# Patient Record
Sex: Male | Born: 1960 | Race: Black or African American | Hispanic: No | Marital: Married | Smoking: Never smoker
Health system: Southern US, Community
[De-identification: ages and names within clinical notes are randomized; demographics above are authoritative.]

## PROBLEM LIST (undated history)

## (undated) DIAGNOSIS — M549 Dorsalgia, unspecified: Secondary | ICD-10-CM

## (undated) DIAGNOSIS — G8929 Other chronic pain: Secondary | ICD-10-CM

## (undated) DIAGNOSIS — D751 Secondary polycythemia: Secondary | ICD-10-CM

## (undated) DIAGNOSIS — M519 Unspecified thoracic, thoracolumbar and lumbosacral intervertebral disc disorder: Secondary | ICD-10-CM

## (undated) DIAGNOSIS — R7989 Other specified abnormal findings of blood chemistry: Secondary | ICD-10-CM

## (undated) DIAGNOSIS — K219 Gastro-esophageal reflux disease without esophagitis: Secondary | ICD-10-CM

## (undated) HISTORY — DX: Other chronic pain: M54.9

## (undated) HISTORY — DX: Other specified abnormal findings of blood chemistry: R79.89

## (undated) HISTORY — DX: Secondary polycythemia: D75.1

## (undated) HISTORY — DX: Unspecified thoracic, thoracolumbar and lumbosacral intervertebral disc disorder: M51.9

## (undated) HISTORY — DX: Other chronic pain: G89.29

## (undated) HISTORY — PX: LIPOMA EXCISION: SHX5283

## (undated) HISTORY — DX: Gastro-esophageal reflux disease without esophagitis: K21.9

---

## 2001-07-17 ENCOUNTER — Emergency Department (HOSPITAL_COMMUNITY): Admission: EM | Admit: 2001-07-17 | Discharge: 2001-07-17 | Payer: Self-pay | Admitting: Emergency Medicine

## 2001-07-29 ENCOUNTER — Encounter: Payer: Self-pay | Admitting: Family Medicine

## 2001-07-29 ENCOUNTER — Ambulatory Visit (HOSPITAL_COMMUNITY): Admission: RE | Admit: 2001-07-29 | Discharge: 2001-07-29 | Payer: Self-pay | Admitting: Family Medicine

## 2003-03-15 ENCOUNTER — Ambulatory Visit (HOSPITAL_COMMUNITY): Admission: RE | Admit: 2003-03-15 | Discharge: 2003-03-15 | Payer: Self-pay | Admitting: Internal Medicine

## 2004-03-06 ENCOUNTER — Ambulatory Visit (HOSPITAL_COMMUNITY): Admission: RE | Admit: 2004-03-06 | Discharge: 2004-03-06 | Payer: Self-pay | Admitting: General Surgery

## 2007-12-25 ENCOUNTER — Emergency Department (HOSPITAL_COMMUNITY): Admission: EM | Admit: 2007-12-25 | Discharge: 2007-12-25 | Payer: Self-pay | Admitting: Emergency Medicine

## 2007-12-29 ENCOUNTER — Ambulatory Visit (HOSPITAL_COMMUNITY): Admission: RE | Admit: 2007-12-29 | Discharge: 2007-12-29 | Payer: Self-pay | Admitting: Internal Medicine

## 2008-01-15 ENCOUNTER — Encounter (HOSPITAL_COMMUNITY): Admission: RE | Admit: 2008-01-15 | Discharge: 2008-02-14 | Payer: Self-pay | Admitting: Orthopaedic Surgery

## 2008-03-03 ENCOUNTER — Encounter: Admission: RE | Admit: 2008-03-03 | Discharge: 2008-03-03 | Payer: Self-pay | Admitting: Neurosurgery

## 2008-03-25 ENCOUNTER — Encounter: Admission: RE | Admit: 2008-03-25 | Discharge: 2008-03-25 | Payer: Self-pay | Admitting: Neurosurgery

## 2009-03-16 ENCOUNTER — Ambulatory Visit (HOSPITAL_COMMUNITY): Admission: RE | Admit: 2009-03-16 | Discharge: 2009-03-16 | Payer: Self-pay | Admitting: Family Medicine

## 2010-12-08 NOTE — H&P (Signed)
[name], [name]                           ACCOUNT NO.:  [id number]   MEDICAL RECORD NO.:  [id number]                   PATIENT TYPE:   LOCATION:                                       FACILITY:   PHYSICIAN:  Dalia Heading, M.D.               DATE OF BIRTH:  January 03, 1961   DATE OF ADMISSION:  DATE OF DISCHARGE:                                HISTORY & PHYSICAL   CHIEF COMPLAINT:  Masses, neck and back.   HISTORY OF PRESENT ILLNESS:  The patient is a 50 year old black male who was  referred for evaluation and treatment of masses on his neck and back.  Both  have been present for some time but recently have increased in size.  No  drainage has been noted.   PAST MEDICAL HISTORY:  Chronic back pain.   PAST SURGICAL HISTORY:  Unremarkable.   CURRENT MEDICATIONS:  1. Flexeril.  2. Lorcet Plus.   ALLERGIES:  NO KNOWN DRUG ALLERGIES.   REVIEW OF SYSTEMS:  Noncontributory.   PHYSICAL EXAMINATION:  GENERAL:  The patient is a well-developed, well-  nourished black male in no acute distress.  VITAL SIGNS:  He is afebrile, and vital signs are stable.  NECK:  Examination reveals a 2-cm oval, subcutaneous, mobile mass noted  along the left lower neck.  No lymphadenopathy is noted.  LUNGS:  Clear to auscultation, with equal breath sounds bilaterally.  HEART:  Regular rate and rhythm without S3, S4, or murmurs.  BACK:  Examination reveals a 6-cm oval, subcutaneous mass in the left upper  back.   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   PLAN:  The patient is scheduled for excision of the masses, back and neck,  on 03/06/2004.  The risks and benefits of the procedure, including bleeding,  infection, and the possibility of recurrence of the masses were fully  explained to the patient who gave informed consent.     ___________________________________________                                         Dalia Heading, M.D.   MAJ/MEDQ  D:  02/29/2004  T:  02/29/2004  Job:   [id number]   cc:   Patrica Duel, M.D.  [address]  [place]  [place] [address]  Fax: [phone]

## 2010-12-08 NOTE — Op Note (Signed)
NAMETODRICK, SIEDSCHLAG                         ACCOUNT NO.:  [id number]   MEDICAL RECORD NO.:  [id number]                   PATIENT TYPE:  AMB   LOCATION:  DAY                                  FACILITY:  [facility]   PHYSICIAN:  Dalia Heading, M.D.               DATE OF BIRTH:  March 04, 1961   DATE OF PROCEDURE:  03/06/2004  DATE OF DISCHARGE:                                 OPERATIVE REPORT   PREOPERATIVE DIAGNOSIS:  Enlarging masses, neck and back.   POSTOPERATIVE DIAGNOSIS:  Enlarging masses, neck and back, lipomas.   PROCEDURE:  Excision of 2-cm mass, neck, and excision of 6-cm mass, back.   SURGEON:  Dr. Franky Macho.   ANESTHESIA:  General endotracheal.   INDICATIONS:  The patient is a 50 year old black male who presents with an  enlarging mass along the left anterior aspect of the neck as well as the  left paraspinal region of the back. The patient comes to the operating room  for excision of both. The risks and benefits of the procedure including  bleeding and infection were fully explained to the patient who gave informed  consent.   PROCEDURE NOTE:  The patient is placed in supine position. After induction  of general endotracheal anesthesia, the left neck was prepped and draped  using the usual sterile technique with Betadine. Surgical site conformation  was performed.   A transverse incision was made over the 2-cm mass which was along the left  anterior triangle. Dissection was taken down to the subcutaneous tissue. A  lipoma was found. It was excised without difficulty. The skin was  reapproximated using a 4-0 Vicryl subcuticular suture. Sterile strips and a  dry sterile dressing were applied.   Next, the patient was rolled into the right lateral decubitus position. The  back was prepped and draped using the usual sterile technique with Betadine.  Surgical site conformation was performed.   A transverse incision was made down to the mass. The mass was noted to be  submuscular in nature. The muscle was divided bluntly, and the 6-cm lipoma  was removed without difficulty. It was sent to pathology for further  examination. Bleeding was controlled using Bovie electrocautery. The fascia  was reapproximated using a 3-0 Vicryl interrupted suture. The subcutaneous  layers were reapproximated using a 3-0 Vicryl interrupted suture. The skin  was closed using a 4-0 Vicryl subcuticular suture. Then 0.5% Sensorcaine was  instilled into the surrounding wound. Sterile strips and dry sterile  dressing were applied.   All tape and needle counts were correct at the end of the procedure. The  patient was extubated in the operating room and went back to recovery room  awake in stable condition.   COMPLICATIONS:  None.   SPECIMENS:  Lipoma, neck and back.   BLOOD LOSS:  Minimal.      ___________________________________________  Dalia Heading, M.D.   MAJ/MEDQ  D:  03/06/2004  T:  03/06/2004  Job:  [id number]   cc:   Patrica Duel, M.D.  [address]  [place]  [place] [address]  Fax: [phone]

## 2010-12-08 NOTE — Consult Note (Signed)
NAME:  [name], [name]                       ACCOUNT NO.:  [id number]   MEDICAL RECORD NO.:  [id number]                  PATIENT TYPE:   LOCATION:                                       FACILITY:   PHYSICIAN:  Lionel December, M.D.                 DATE OF BIRTH:  [birth year]   DATE OF CONSULTATION:  03/03/2003  DATE OF DISCHARGE:                                   CONSULTATION   CONSULTING PHYSICIAN:  Lionel December, M.D.   REFERRING PHYSICIAN:  Corrie Mckusick, M.D.   REASON FOR CONSULTATION:  Dysphagia, odynophagia.   HISTORY OF PRESENT ILLNESS:  [name] is a 50 year old African American male who  was referred through courtesy of Dr. Assunta Found for evaluation of  dysphagia and odynophagia.  He states that his symptoms started about a  month ago.  He is experiencing dysphagia and odynophagia virtually every  day.  He has changed his eating habits without much success.  He notices  most difficulty with meats.  He is able to eventually get relief  spontaneously.  He saw Dr. Phillips Odor on February 09, 2003.  He was begun on  Protonix.  He states that it has lessened intensity of pain and heartburn  but the symptoms are not a great deal improved.  He denies nausea, vomiting,  hematemesis, hoarseness or chronic cough.  He has a good appetite but he is  afraid to eat because of pain and as a result has lost 10 pounds in the last  one month.  He denies melena, rectal bleeding or abdominal pain.  He also  has noted retrosternal discomfort with sodas and now he is drinking water  and Kool-Aid.  He has chronic low-back pain.  He takes Advil 2-3 times a  week, each time he takes 3-4 pills.  He is also on hydrocodone p.r.n. which  he does not take daily and Protonix 40 mg q.a.m.  He does not take any other  OTC meds.   PAST MEDICAL HISTORY:  He has chronic low-back pain secondary to bulging  disk which has now deteriorated.  He has had 30 shots of steroids all  together as well as acupuncture.  He  is considering having surgery.   ALLERGIES:  None known.   FAMILY HISTORY:  Both parents, two sisters and six brothers are in good  health.   SOCIAL HISTORY:  He is married with one daughter.  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  He has  never smoked cigarettes and he generally drinks six  drinks over the weekend, each weekend.   PHYSICAL EXAMINATION:  GENERAL:  A pleasant, well-developed, well-nourished,  African American male who is in no acute distress.  VITAL SIGNS:  He weighs 236 pounds.  He is 6 feet 1 inch tall.  Pulse 70 per  minute, blood pressure 100/80, temp is 98.6.  HEENT:  Conjunctivae is pink.  Sclerae is nonicteric.  Oropharyngeal mucosa  is normal.  Dentition in satisfactory condition.  NECK:  Without masses or thyromegaly.  CARDIAC:  Regular rhythm.  Normal S1, S2.  No murmur or gallop noted.  ABDOMEN:  Full but is soft and nontender without organomegaly or masses.  EXTREMITIES:  He does not have peripheral edema or clubbing.   ASSESSMENT:  [name] is a 50 year old African American male who presents with a  one-month history of dysphagia, odynophagia and daily heartburn.  He has not  responded to a proton pump inhibitor that he has been on for about three  weeks now.  He does not have any other risk factors.  I wonder if he has  distal esophageal ring and he may have gotten esophagitis secondary to Advil  getting caught in this area (pill esophagitis).  He also could have  complicated reflux esophagitis with a stricture although, this would be  unusual given the rather short history.   RECOMMENDATIONS:  1. He will continue anti-reflux measures as before.  2. We will increase his Protonix to 40 mg b.i.d. samples given to supplement     his prescription.  3. Esophagogastroduodenoscopy and possible esophageal dilatation to be     performed at [facility] in the near future.   We would like to  thank Dr. Phillips Odor for the opportunity to participate in the  care of this gentleman.                                               Lionel December, M.D.    NR/MEDQ  D:  03/03/2003  T:  03/03/2003  Job:  [id number]   cc:   Corrie Mckusick, M.D.  [address] [place]. [place]  [place] [address]  Fax: [phone]   [facility]  [facility]

## 2010-12-13 ENCOUNTER — Emergency Department (HOSPITAL_COMMUNITY)
Admission: EM | Admit: 2010-12-13 | Discharge: 2010-12-13 | Disposition: A | Discharge status: Home / Self Care | Payer: BC Managed Care – PPO | Attending: Emergency Medicine | Admitting: Emergency Medicine

## 2010-12-13 ENCOUNTER — Emergency Department (HOSPITAL_COMMUNITY): Payer: BC Managed Care – PPO

## 2010-12-13 ENCOUNTER — Encounter: Payer: Self-pay | Admitting: Cardiology

## 2010-12-13 DIAGNOSIS — IMO0002 Reserved for concepts with insufficient information to code with codable children: Secondary | ICD-10-CM | POA: Insufficient documentation

## 2010-12-13 DIAGNOSIS — R079 Chest pain, unspecified: Secondary | ICD-10-CM | POA: Insufficient documentation

## 2010-12-13 LAB — COMPREHENSIVE METABOLIC PANEL
AST: 17 U/L (ref 0–37)
Albumin: 3.4 g/dL — ABNORMAL LOW (ref 3.5–5.2)
Alkaline Phosphatase: 65 U/L (ref 39–117)
BUN: 13 mg/dL (ref 6–23)
Chloride: 106 mEq/L (ref 96–112)
Potassium: 4 mEq/L (ref 3.5–5.1)
Total Bilirubin: 0.2 mg/dL — ABNORMAL LOW (ref 0.3–1.2)

## 2010-12-13 LAB — CBC
HCT: 42.8 % (ref 39.0–52.0)
Hemoglobin: 13.3 g/dL (ref 13.0–17.0)
MCV: 69.9 fL — ABNORMAL LOW (ref 78.0–100.0)
Platelets: 133 10*3/uL — ABNORMAL LOW (ref 150–400)
RBC: 6.12 MIL/uL — ABNORMAL HIGH (ref 4.22–5.81)
WBC: 5.4 10*3/uL (ref 4.0–10.5)

## 2010-12-13 LAB — DIFFERENTIAL
Eosinophils Relative: 1 % (ref 0–5)
Lymphocytes Relative: 30 % (ref 12–46)
Monocytes Absolute: 0.3 10*3/uL (ref 0.1–1.0)
Monocytes Relative: 6 % (ref 3–12)
Neutrophils Relative %: 63 % (ref 43–77)

## 2010-12-28 ENCOUNTER — Telehealth: Payer: Self-pay

## 2010-12-28 DIAGNOSIS — Z139 Encounter for screening, unspecified: Secondary | ICD-10-CM

## 2010-12-28 NOTE — Telephone Encounter (Signed)
OK for colonscopy 

## 2010-12-28 NOTE — Telephone Encounter (Signed)
Gastroenterology Pre-Procedure Form  Request Date: 12/25/2010     Requesting Physician: Dr. Phillips Odor     PATIENT INFORMATION:  [name] is a 50 y.o., male (DOB=09-Apr-1961).  PROCEDURE: Procedure(s) requested: colonoscopy Procedure Reason: screening for colon cancer  PATIENT REVIEW QUESTIONS: The patient reports the following:   1. Diabetes Melitis: no 2. Joint replacements in the past 12 months: no 3. Major health problems in the past 3 months: no 4. Has an artificial valve or MVP:no 5. Has been advised in past to take antibiotics in advance of a procedure like teeth cleaning: no}    MEDICATIONS & ALLERGIES:    Patient reports the following regarding taking any blood thinners:   Plavix? no Aspirin?no Coumadin?  no  Patient confirms/reports the following medications:  Current Outpatient Prescriptions  Medication Sig Dispense Refill  . cyclobenzaprine (FLEXERIL) 10 MG tablet Take 10 mg by mouth 3 (three) times daily as needed.        Marland Kitchen ibuprofen (ADVIL,MOTRIN) 800 MG tablet Take 800 mg by mouth every 8 (eight) hours as needed.          Patient confirms/reports the following allergies:  No Known Allergies  Patient is appropriate to schedule for requested procedure(s): yes  AUTHORIZATION INFORMATION Primary Insurance: [employer] Pre-Cert / Auth required Animal nutritionist / Auth #:  Secondary Designer, multimedia / Auth required: Pre-Cert / Auth #:   Orders Placed This Encounter  Procedures  . Endoscopy, colon, diagnostic    Standing Status: Future     Number of Occurrences:      Standing Expiration Date: 12/28/2011    Order Specific Question:  Pre-op diagnosis    Answer:  Screening colonoscopy    Order Specific Question:  Pre-op visit required?    Answer:  No [0]    SCHEDULE INFORMATION: Procedure has been scheduled as follows:  Date: 01/02/2011    Time: 10:15 AM  Location: [facility] Short Stay  This Gastroenterology Pre-Precedure Form is being routed to the  following provider(s) for review: R. Roetta Sessions, MD    Pt's Rx and instructions faxed to [facility] in [place], [place]

## 2011-01-02 ENCOUNTER — Ambulatory Visit (HOSPITAL_COMMUNITY)
Admission: RE | Admit: 2011-01-02 | Payer: BC Managed Care – PPO | Source: Ambulatory Visit | Admitting: Internal Medicine

## 2011-01-02 ENCOUNTER — Encounter: Payer: BC Managed Care – PPO | Admitting: Internal Medicine

## 2011-07-25 ENCOUNTER — Encounter: Payer: Self-pay | Admitting: Cardiology

## 2011-08-15 ENCOUNTER — Ambulatory Visit (INDEPENDENT_AMBULATORY_CARE_PROVIDER_SITE_OTHER): Payer: Managed Care, Other (non HMO) | Admitting: Cardiology

## 2011-08-15 ENCOUNTER — Encounter: Payer: Self-pay | Admitting: Cardiology

## 2011-08-15 DIAGNOSIS — R079 Chest pain, unspecified: Secondary | ICD-10-CM | POA: Insufficient documentation

## 2011-08-15 DIAGNOSIS — R030 Elevated blood-pressure reading, without diagnosis of hypertension: Secondary | ICD-10-CM | POA: Insufficient documentation

## 2011-08-15 DIAGNOSIS — R9431 Abnormal electrocardiogram [ECG] [EKG]: Secondary | ICD-10-CM

## 2011-08-15 NOTE — Assessment & Plan Note (Signed)
Episode as noted above associated with dyspnea and near syncope. ECG is abnormal at baseline. Cardiac risk factors include age and gender, family history of premature CAD in his mother. Plan is to proceed with an exercise echocardiogram for objective ischemic evaluation.

## 2011-08-15 NOTE — Progress Notes (Signed)
   Clinical Summary [name] is a 51 y.o.male referred for cardiology consultation by Dr. Phillips Powell. Actually the consult was from 5/12 however the patient has not presented until now. He reports an episode of severe dyspnea and chest pain that occurred at dinner back in 5/12 associated with near syncope. He was noted to be significantly hypertensive and evaluated in the ED with negative troponin I and chest x-ray.  He states that he did not feel back to baseline for a week. SInce then no recurrence. ECG is abnormal showing inferolateral Q waves.  He has not undergone any prior stress testing.   No Known Allergies  Current Outpatient Prescriptions  Medication Sig Dispense Refill  . cyclobenzaprine (FLEXERIL) 10 MG tablet Take 10 mg by mouth 3 (three) times daily as needed.        Marland Kitchen ibuprofen (ADVIL,MOTRIN) 800 MG tablet Take 800 mg by mouth every 8 (eight) hours as needed.          Past Medical History  Diagnosis Date  . Chronic back pain   . GERD (gastroesophageal reflux disease)   . Lumbar disc disease     Past Surgical History  Procedure Date  . Lipoma excision     Family History  Problem Relation Age of Onset  . Coronary artery disease Mother   . Diabetes type II Sister     Social History Mr. [name] reports that he has never smoked. He has never used smokeless tobacco. Mr. [name] reports that he drinks alcohol.  Review of Systems No palpitations or syncope. Appetite stable. No cough, fevers, chills. Chronic back pain. Otherwise negative.  Physical Examination Filed Vitals:   08/15/11 1448  BP: 120/79  Pulse: 73   Obese male in NAD. HEENT: Conjunctiva and lids normal, oropharynx clear with moist mucosa. Neck: Supple, no elevated JVP or carotid bruits, no thyromegaly. Lungs: Clear to auscultation, nonlabored breathing at rest. Cardiac: Regular rate and rhythm, no S3 or significant systolic murmur, no pericardial rub. Abdomen: Soft, nontender, bowel sounds  present. Extremities: No pitting edema, distal pulses 2+. Skin: Warm and dry. Musculoskeletal: No kyphosis. Neuropsychiatric: Alert and oriented x3, affect grossly appropriate.   ECG Sinus rhythm with inferolateral Q waves, NST changes.   Problem List and Plan

## 2011-08-15 NOTE — Assessment & Plan Note (Signed)
Could be nonspecific finding, but need to exclude evidence of prior infarct as well as active ischemia.

## 2011-08-15 NOTE — Assessment & Plan Note (Signed)
Blood pressure is good today. Should keep regular followup with primary care. Not on antihypertensives now.

## 2011-08-15 NOTE — Patient Instructions (Signed)
Your physician recommends that you schedule a follow-up appointment in: We will call you with results  Your physician has requested that you have a stress echocardiogram. For further information please visit [email or web address]. Please follow instruction sheet as given.   

## 2011-08-31 ENCOUNTER — Encounter (HOSPITAL_COMMUNITY): Payer: Self-pay | Admitting: Cardiology

## 2011-08-31 ENCOUNTER — Ambulatory Visit (HOSPITAL_COMMUNITY)
Admission: RE | Admit: 2011-08-31 | Discharge: 2011-08-31 | Disposition: A | Discharge status: Home / Self Care | Payer: Managed Care, Other (non HMO) | Source: Ambulatory Visit | Attending: Cardiology | Admitting: Cardiology

## 2011-08-31 DIAGNOSIS — R030 Elevated blood-pressure reading, without diagnosis of hypertension: Secondary | ICD-10-CM | POA: Insufficient documentation

## 2011-08-31 DIAGNOSIS — R072 Precordial pain: Secondary | ICD-10-CM

## 2011-08-31 DIAGNOSIS — R9431 Abnormal electrocardiogram [ECG] [EKG]: Secondary | ICD-10-CM

## 2011-08-31 DIAGNOSIS — R0602 Shortness of breath: Secondary | ICD-10-CM | POA: Insufficient documentation

## 2011-08-31 DIAGNOSIS — R079 Chest pain, unspecified: Secondary | ICD-10-CM | POA: Insufficient documentation

## 2011-08-31 NOTE — Progress Notes (Signed)
*  PRELIMINARY RESULTS* Echocardiogram Echocardiogram Stress Test has been performed.  [name] 08/31/2011, 10:52 AM

## 2011-08-31 NOTE — Progress Notes (Signed)
Stress Lab Nurses Notes - [facility]  CYRUSS ARATA 08/31/2011  Reason for doing test: Chest Pain Type of test: Stress Echo Nurse performing test: Parke Poisson, RN Nuclear Medicine Tech: Not Applicable Echo Tech: Karrie Doffing MD performing test: R. Rothbart Family MD: Phillips Odor  Test explained and consent signed: yes IV started: No IV started Symptoms:  Mild SOB & fatigue Treatment/Intervention: None Reason test stopped: fatigue and reached target HR After recovery IV was: NA Patient to return to Nuc. Med at : NA Patient discharged: Home Patient's Condition upon discharge was: stable Comments: During test peak BP 162/68 & HR 129.  Recovery BP 128/58 & HR 70.  Symptoms resolved in recovery. Erskine Speed T

## 2012-09-09 ENCOUNTER — Encounter (HOSPITAL_COMMUNITY): Payer: Self-pay | Admitting: Oncology

## 2012-09-09 ENCOUNTER — Encounter (HOSPITAL_COMMUNITY): Payer: Managed Care, Other (non HMO) | Attending: Oncology | Admitting: Oncology

## 2012-09-09 VITALS — BP 135/82 | HR 72 | Temp 98.2°F | Wt 248.3 lb

## 2012-09-09 DIAGNOSIS — R7989 Other specified abnormal findings of blood chemistry: Secondary | ICD-10-CM

## 2012-09-09 DIAGNOSIS — E7800 Pure hypercholesterolemia, unspecified: Secondary | ICD-10-CM | POA: Insufficient documentation

## 2012-09-09 DIAGNOSIS — D751 Secondary polycythemia: Secondary | ICD-10-CM | POA: Insufficient documentation

## 2012-09-09 HISTORY — DX: Secondary polycythemia: D75.1

## 2012-09-09 HISTORY — DX: Other specified abnormal findings of blood chemistry: R79.89

## 2012-09-09 LAB — CBC WITH DIFFERENTIAL/PLATELET
Basophils Relative: 0 % (ref 0–1)
Eosinophils Relative: 1 % (ref 0–5)
HCT: 44.5 % (ref 39.0–52.0)
Lymphs Abs: 1.8 10*3/uL (ref 0.7–4.0)
MCV: 69.5 fL — ABNORMAL LOW (ref 78.0–100.0)
Monocytes Relative: 6 % (ref 3–12)
Neutro Abs: 3 10*3/uL (ref 1.7–7.7)
RBC: 6.4 MIL/uL — ABNORMAL HIGH (ref 4.22–5.81)
WBC: 5.2 10*3/uL (ref 4.0–10.5)

## 2012-09-09 NOTE — Progress Notes (Signed)
Shayne Alken Mccubbins's reason for MD visit and labs were also performed.  Venipuncture performed with a 23 gauge butterfly needle to L Antecubital.  Imelda Pillow tolerated venipuncture well and without incident; questions were answered and patient was discharged.

## 2012-09-09 NOTE — Patient Instructions (Signed)
[facility] Discharge Instructions  RECOMMENDATIONS MADE BY THE CONSULTANT AND ANY TEST RESULTS WILL BE SENT TO YOUR REFERRING PHYSICIAN.  EXAM FINDINGS BY THE PHYSICIAN TODAY AND SIGNS OR SYMPTOMS TO REPORT TO CLINIC OR PRIMARY PHYSICIAN: Exam findings as discussed by T. Kefalas, PA-C.  SPECIAL INSTRUCTIONS/FOLLOW-UP: 1.  Labs today with a repeat office visit to be done in 2 weeks.  Please let us know if you have questions before your next visit.  Thank you for choosing [facility] to provide your oncology and hematology care.  To afford each patient quality time with our providers, please arrive at least 15 minutes before your scheduled appointment time.  With your help, our goal is to use those 15 minutes to complete the necessary work-up to ensure our physicians have the information they need to help with your evaluation and healthcare recommendations.    Effective January 1st, 2014, we ask that you re-schedule your appointment with our physicians should you arrive 10 or more minutes late for your appointment.  We strive to give you quality time with our providers, and arriving late affects you and other patients whose appointments are after yours.    Again, thank you for choosing [facility].  Our hope is that these requests will decrease the amount of time that you wait before being seen by our physicians.       _____________________________________________________________  Should you have questions after your visit to [facility], please contact our office at [phone] between the hours of 8:30 a.m. and 5:00 p.m.  Voicemails left after 4:30 p.m. will not be returned until the following business day.  For prescription refill requests, have your pharmacy contact our office with your prescription refill request.

## 2012-09-09 NOTE — Progress Notes (Signed)
[facility] NEW PATIENT EVALUATION   Name: [name] Date: 09/09/2012 MRN: [id number] DOB: [birth year]    CC: Nathan Ruths, MD  Karleen Hampshire, MD   DIAGNOSIS: The primary encounter diagnosis was Erythrocytosis. A diagnosis of Elevated ferritin level was also pertinent to this visit.   HISTORY OF PRESENT ILLNESS:[name] is a 52 y.o. male with a past medical history of headaches, HTN, and abnormal EKG, who was referred to the [facility] for "abnormal lab work."  Labs from the patient's PCP office dated 08/22/2012 show: WBC 4.5 RBC 6.72 Hgb 14.5 Hct 44.9 MCV 66.8 MCH 21.6 MCHC 32.3 RDW 17.4 Plt 151 Iron 154 TIBC 330 % Sat 47 Vit B12 718 Folate 17.2 Ferritin 432  [name] reports that he went to see [facility] group for his regular physical exam.  His exam went well.  At that time he was having a rash on his UE and back.  This rash was pruritic and was treated with Lotrisone.  The rash has since resolved completely.  Otherwise, his exam was benign and unremarkable. Labs were performed that revealed the aforementioned.  He was therefore referred to the [facility] for evaluation of abnormal lab results.   [name] feels great.  He denies any complaints.  He denies any headaches, dizziness, double vision, fevers, chills, night sweats, nausea, vomiting, diarrhea, constipation, abdominal pain, chest pain, heart palpitations, nausea, vomiting, blood in stool, black tarry stool, urinary pain/burning/frequency.  I personally reviewed and went over laboratory results with the patient.  I made 2 copies of his lab work for him to go home with.   I provided the patient education regarding his lab work.  He is unaware of anyone in his family having a Thalassemia trait or blood disorders.   So we will perform lab work to prove that he has a Scientist, research (life sciences).  Since his Hgb and Hct are solid and WNL, will likely release the patient once  we ascertain a diagnosis.    [name] reported to the ED in Jan 2013 with chest pain.  He was seen by Dr. Diona Browner, Cardiology, who performed a stress test and that was negative.     FAMILY HISTORY: family history includes Coronary artery disease in his mother and Diabetes type II in his sister. Mother is 36 with a number of chronic disease including heart disease. Father is estranged  Brothers: 70, 53, 54, 82, 50, 46 all alive and healthy Sisters: 34, 93 both alive and healthy. [name] does not have any siblings from the same father as his. 1 daughter, 42, healthy   PAST MEDICAL HISTORY:  has a past medical history of Chronic back pain; GERD (gastroesophageal reflux disease); Lumbar disc disease; Erythrocytosis (09/09/2012); and Elevated ferritin level (09/09/2012).       CURRENT MEDICATIONS: See [facility]   SOCIAL HISTORY:  Born and raised in [place], [place].  Completed high school.  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  He is married, but presently separated.  He admits to 1-2 liquour drinks per week of Skyy cherry vodka.  He denies tobacco abuse.  He admits to a history of 2 joints per week of Marijuana quitting last August.   ALLERGIES: Review of patient's allergies indicates no known allergies.   LABORATORY DATA:   Labs from the patient's PCP office dated 08/22/2012 show: WBC 4.5 RBC 6.72 Hgb 14.5 Hct 44.9 MCV 66.8 MCH 21.6 MCHC 32.3 RDW 17.4 Plt 151 Iron 154 TIBC  330 % Sat 47 Vit B12 718 Folate 17.2 Ferritin 432    RADIOGRAPHY: No results found.      REVIEW OF SYSTEMS: Patient reports no health concerns.   PHYSICAL EXAM:  vitals were not taken for this visit. General appearance: alert, cooperative, appears stated age, no distress and moderately obese Head: Normocephalic, without obvious abnormality, atraumatic Neck: no adenopathy, supple, symmetrical, trachea midline and thyroid not enlarged, symmetric, no tenderness/mass/nodules Lymph nodes: Cervical,  supraclavicular, and axillary nodes normal. Resp: clear to auscultation bilaterally and normal percussion bilaterally Back: symmetric, no curvature. ROM normal. No CVA tenderness. Cardio: regular rate and rhythm, S1, S2 normal, no murmur, click, rub or gallop GI: soft, non-tender; bowel sounds normal; no masses,  no organomegaly Extremities: extremities normal, atraumatic, no cyanosis or edema Neurologic: Alert and oriented X 3, normal strength and tone. Normal symmetric reflexes. Normal coordination and gait     IMPRESSION:  1. Erythrocytosis with a normal Hgb and Hct, microcytosis, low MCH, and elevated RDW.  ?Beta-Thalassemia trait. 2. WNL WBC, HGB, HCT, and PLT count 3. History of headaches 4. Hypercholesterolemia 5. Back pain  6. Bulging lumbar disc   PLAN:  1. I personally reviewed and went over laboratory results with the patient. 2. Printed 2 copies of the patient's lab work for him to take home 3. Patient education regarding his lab results.  4. Patient education regarding Beta-Thalassemia 5. Patient educated that if he is positive for this disorder, there is a 50/50 chance his daughter has this condition as well.  6. Will defer GERD treatment to PCP 7. Labs today: CBC diff, Iron/TIBC, Ferritin, hemoglobinopathy evaluation 8. Return in 2-3 weeks for follow-up.  If patient is + for Beta-Thalassemia, will release the patient from the clinic since his Hgb and Plt count are solid and WNL.  Patient knows to call the clinic with any questions or concerns.  Patient and plan discussed with Dr. Demetrios Isaacs and he is in agreement with the aforementioned.  Patient seen by Dr. Mariel Sleet as well.  More than 50% of the time spent with the patient was utilized for counseling and coordination of care.  [name]

## 2012-09-10 LAB — IRON AND TIBC: TIBC: 250 ug/dL (ref 215–435)

## 2012-10-03 ENCOUNTER — Ambulatory Visit (HOSPITAL_COMMUNITY): Payer: Managed Care, Other (non HMO) | Admitting: Oncology

## 2012-10-03 ENCOUNTER — Telehealth (HOSPITAL_COMMUNITY): Payer: Self-pay | Admitting: Oncology

## 2012-10-03 DIAGNOSIS — D751 Secondary polycythemia: Secondary | ICD-10-CM

## 2012-10-03 DIAGNOSIS — R7989 Other specified abnormal findings of blood chemistry: Secondary | ICD-10-CM

## 2012-10-03 NOTE — Progress Notes (Signed)
-   no show-  Letter sent to patient and PCP

## 2012-10-03 NOTE — Telephone Encounter (Signed)
Per T. Kefalas, PA-C, the patient needs to have a hemoglobin electrophoresis ordered to complete the Thalassemia work-up.  I made several attempts to contact the patient to have him come back in for labs and to also reschedule his PA appointment - NONE OF THE PHONE NUMBERS PROVIDED BY THE PATIENT ARE VALID.  Will wait to see if patient shows today for his OV and will do labs at that time and r/s his office visit for a later date as well.

## 2012-11-03 ENCOUNTER — Encounter (HOSPITAL_COMMUNITY): Payer: Self-pay | Admitting: Oncology

## 2012-12-01 ENCOUNTER — Encounter (HOSPITAL_COMMUNITY): Payer: Self-pay | Admitting: Oncology

## 2012-12-31 ENCOUNTER — Encounter (HOSPITAL_COMMUNITY): Payer: Self-pay | Admitting: Oncology

## 2013-08-10 ENCOUNTER — Other Ambulatory Visit (HOSPITAL_COMMUNITY): Payer: Self-pay | Admitting: Physician Assistant

## 2013-08-10 ENCOUNTER — Ambulatory Visit (HOSPITAL_COMMUNITY)
Admission: RE | Admit: 2013-08-10 | Discharge: 2013-08-10 | Disposition: A | Discharge status: Home / Self Care | Payer: Managed Care, Other (non HMO) | Source: Ambulatory Visit | Attending: Physician Assistant | Admitting: Physician Assistant

## 2013-08-10 DIAGNOSIS — M25569 Pain in unspecified knee: Secondary | ICD-10-CM

## 2013-08-10 DIAGNOSIS — M898X9 Other specified disorders of bone, unspecified site: Secondary | ICD-10-CM | POA: Insufficient documentation

## 2013-09-22 ENCOUNTER — Other Ambulatory Visit: Payer: Self-pay | Admitting: Urology

## 2013-09-22 ENCOUNTER — Ambulatory Visit (INDEPENDENT_AMBULATORY_CARE_PROVIDER_SITE_OTHER): Payer: Managed Care, Other (non HMO) | Admitting: Urology

## 2013-09-22 DIAGNOSIS — R310 Gross hematuria: Secondary | ICD-10-CM

## 2013-09-22 DIAGNOSIS — R109 Unspecified abdominal pain: Secondary | ICD-10-CM

## 2013-09-25 ENCOUNTER — Ambulatory Visit (HOSPITAL_COMMUNITY)
Admission: RE | Admit: 2013-09-25 | Discharge: 2013-09-25 | Disposition: A | Discharge status: Home / Self Care | Payer: Managed Care, Other (non HMO) | Source: Ambulatory Visit | Attending: Urology | Admitting: Urology

## 2013-09-25 DIAGNOSIS — R319 Hematuria, unspecified: Secondary | ICD-10-CM | POA: Insufficient documentation

## 2013-09-25 DIAGNOSIS — R310 Gross hematuria: Secondary | ICD-10-CM

## 2013-09-25 MED ORDER — IOHEXOL 300 MG/ML  SOLN
125.0000 mL | Freq: Once | INTRAMUSCULAR | Status: AC | PRN
  Administered 2013-09-25: 125 mL via INTRAVENOUS

## 2013-10-20 ENCOUNTER — Ambulatory Visit (INDEPENDENT_AMBULATORY_CARE_PROVIDER_SITE_OTHER): Payer: Managed Care, Other (non HMO) | Admitting: Urology

## 2013-10-20 DIAGNOSIS — R3129 Other microscopic hematuria: Secondary | ICD-10-CM

## 2014-06-21 ENCOUNTER — Ambulatory Visit (HOSPITAL_COMMUNITY)
Admission: RE | Admit: 2014-06-21 | Discharge: 2014-06-21 | Disposition: A | Discharge status: Home / Self Care | Payer: Managed Care, Other (non HMO) | Source: Ambulatory Visit | Attending: Family Medicine | Admitting: Family Medicine

## 2014-06-21 ENCOUNTER — Other Ambulatory Visit (HOSPITAL_COMMUNITY): Payer: Self-pay | Admitting: Family Medicine

## 2014-06-21 DIAGNOSIS — R519 Headache, unspecified: Secondary | ICD-10-CM

## 2014-06-21 DIAGNOSIS — R51 Headache: Secondary | ICD-10-CM | POA: Insufficient documentation

## 2014-06-21 DIAGNOSIS — H9201 Otalgia, right ear: Secondary | ICD-10-CM

## 2014-06-21 DIAGNOSIS — R42 Dizziness and giddiness: Secondary | ICD-10-CM

## 2014-10-18 ENCOUNTER — Ambulatory Visit (HOSPITAL_COMMUNITY)
Admission: RE | Admit: 2014-10-18 | Discharge: 2014-10-18 | Disposition: A | Discharge status: Home / Self Care | Payer: Managed Care, Other (non HMO) | Source: Ambulatory Visit | Attending: Family Medicine | Admitting: Family Medicine

## 2014-10-18 ENCOUNTER — Other Ambulatory Visit (HOSPITAL_COMMUNITY): Payer: Self-pay | Admitting: Family Medicine

## 2014-10-18 DIAGNOSIS — M79672 Pain in left foot: Secondary | ICD-10-CM | POA: Diagnosis not present

## 2014-10-18 DIAGNOSIS — W1839XA Other fall on same level, initial encounter: Secondary | ICD-10-CM | POA: Insufficient documentation

## 2014-10-18 DIAGNOSIS — M19072 Primary osteoarthritis, left ankle and foot: Secondary | ICD-10-CM | POA: Diagnosis not present

## 2014-11-19 ENCOUNTER — Other Ambulatory Visit (HOSPITAL_COMMUNITY): Payer: Self-pay | Admitting: Sports Medicine

## 2014-11-19 ENCOUNTER — Ambulatory Visit (HOSPITAL_COMMUNITY)
Admission: RE | Admit: 2014-11-19 | Discharge: 2014-11-19 | Disposition: A | Discharge status: Home / Self Care | Payer: Managed Care, Other (non HMO) | Source: Ambulatory Visit | Attending: Internal Medicine | Admitting: Internal Medicine

## 2014-11-19 DIAGNOSIS — M7989 Other specified soft tissue disorders: Secondary | ICD-10-CM | POA: Diagnosis not present

## 2014-11-19 DIAGNOSIS — M79604 Pain in right leg: Secondary | ICD-10-CM | POA: Insufficient documentation

## 2014-11-19 DIAGNOSIS — M79601 Pain in right arm: Secondary | ICD-10-CM | POA: Diagnosis not present

## 2014-11-19 NOTE — Progress Notes (Signed)
Right lower extremity venous duplex completed. No evidence for DVT, SVT, or Baker's cyst. °Brianna L Mazza,RVT °

## 2014-11-26 ENCOUNTER — Telehealth (HOSPITAL_COMMUNITY): Payer: Self-pay | Admitting: *Deleted

## 2015-07-19 ENCOUNTER — Other Ambulatory Visit (HOSPITAL_COMMUNITY): Payer: Self-pay | Admitting: Internal Medicine

## 2015-07-19 ENCOUNTER — Ambulatory Visit (HOSPITAL_COMMUNITY)
Admission: RE | Admit: 2015-07-19 | Discharge: 2015-07-19 | Disposition: A | Discharge status: Home / Self Care | Payer: Managed Care, Other (non HMO) | Source: Ambulatory Visit | Attending: Internal Medicine | Admitting: Internal Medicine

## 2015-07-19 DIAGNOSIS — R109 Unspecified abdominal pain: Secondary | ICD-10-CM | POA: Insufficient documentation

## 2015-07-19 DIAGNOSIS — R935 Abnormal findings on diagnostic imaging of other abdominal regions, including retroperitoneum: Secondary | ICD-10-CM | POA: Diagnosis not present

## 2015-07-19 DIAGNOSIS — R079 Chest pain, unspecified: Secondary | ICD-10-CM | POA: Diagnosis not present

## 2015-07-19 DIAGNOSIS — N200 Calculus of kidney: Secondary | ICD-10-CM

## 2015-07-19 DIAGNOSIS — R319 Hematuria, unspecified: Secondary | ICD-10-CM | POA: Diagnosis not present

## 2015-07-20 ENCOUNTER — Other Ambulatory Visit (HOSPITAL_COMMUNITY): Payer: Self-pay | Admitting: Internal Medicine

## 2015-07-20 DIAGNOSIS — R1013 Epigastric pain: Secondary | ICD-10-CM

## 2015-07-21 ENCOUNTER — Ambulatory Visit (HOSPITAL_COMMUNITY): Admission: RE | Admit: 2015-07-21 | Payer: Managed Care, Other (non HMO) | Source: Ambulatory Visit

## 2015-07-21 ENCOUNTER — Ambulatory Visit (HOSPITAL_COMMUNITY)
Admission: RE | Admit: 2015-07-21 | Discharge: 2015-07-21 | Disposition: A | Discharge status: Home / Self Care | Payer: Managed Care, Other (non HMO) | Source: Ambulatory Visit | Attending: Internal Medicine | Admitting: Internal Medicine

## 2015-07-21 DIAGNOSIS — R1012 Left upper quadrant pain: Secondary | ICD-10-CM | POA: Diagnosis not present

## 2015-07-21 DIAGNOSIS — N281 Cyst of kidney, acquired: Secondary | ICD-10-CM | POA: Diagnosis not present

## 2015-07-21 DIAGNOSIS — R1013 Epigastric pain: Secondary | ICD-10-CM | POA: Diagnosis present

## 2015-07-22 ENCOUNTER — Ambulatory Visit (HOSPITAL_COMMUNITY): Payer: Managed Care, Other (non HMO)

## 2016-04-06 ENCOUNTER — Ambulatory Visit (HOSPITAL_COMMUNITY)
Admission: RE | Admit: 2016-04-06 | Discharge: 2016-04-06 | Disposition: A | Discharge status: Home / Self Care | Payer: 59 | Source: Ambulatory Visit | Attending: Registered Nurse | Admitting: Registered Nurse

## 2016-04-06 ENCOUNTER — Other Ambulatory Visit (HOSPITAL_COMMUNITY): Payer: Self-pay | Admitting: Registered Nurse

## 2016-04-06 DIAGNOSIS — M79661 Pain in right lower leg: Secondary | ICD-10-CM

## 2016-04-06 DIAGNOSIS — M79604 Pain in right leg: Secondary | ICD-10-CM | POA: Diagnosis not present

## 2016-04-06 DIAGNOSIS — M7989 Other specified soft tissue disorders: Secondary | ICD-10-CM

## 2016-08-20 ENCOUNTER — Other Ambulatory Visit (HOSPITAL_COMMUNITY): Payer: Self-pay | Admitting: Internal Medicine

## 2016-08-20 ENCOUNTER — Ambulatory Visit (HOSPITAL_COMMUNITY)
Admission: RE | Admit: 2016-08-20 | Discharge: 2016-08-20 | Disposition: A | Discharge status: Home / Self Care | Payer: 59 | Source: Ambulatory Visit | Attending: Internal Medicine | Admitting: Internal Medicine

## 2016-08-20 DIAGNOSIS — S59902A Unspecified injury of left elbow, initial encounter: Secondary | ICD-10-CM | POA: Diagnosis not present

## 2016-08-20 DIAGNOSIS — X58XXXA Exposure to other specified factors, initial encounter: Secondary | ICD-10-CM | POA: Diagnosis not present

## 2016-08-20 DIAGNOSIS — M7989 Other specified soft tissue disorders: Secondary | ICD-10-CM | POA: Diagnosis not present

## 2016-08-24 ENCOUNTER — Ambulatory Visit: Payer: Managed Care, Other (non HMO) | Admitting: Endocrinology

## 2017-08-08 DIAGNOSIS — Z6833 Body mass index (BMI) 33.0-33.9, adult: Secondary | ICD-10-CM | POA: Diagnosis not present

## 2017-08-08 DIAGNOSIS — R39198 Other difficulties with micturition: Secondary | ICD-10-CM | POA: Diagnosis not present

## 2017-08-08 DIAGNOSIS — Z0000 Encounter for general adult medical examination without abnormal findings: Secondary | ICD-10-CM | POA: Diagnosis not present

## 2017-08-08 DIAGNOSIS — E6609 Other obesity due to excess calories: Secondary | ICD-10-CM | POA: Diagnosis not present

## 2017-08-08 DIAGNOSIS — L309 Dermatitis, unspecified: Secondary | ICD-10-CM | POA: Diagnosis not present

## 2017-08-08 DIAGNOSIS — Z0001 Encounter for general adult medical examination with abnormal findings: Secondary | ICD-10-CM | POA: Diagnosis not present

## 2017-08-08 DIAGNOSIS — R7309 Other abnormal glucose: Secondary | ICD-10-CM | POA: Diagnosis not present

## 2017-11-14 DIAGNOSIS — R42 Dizziness and giddiness: Secondary | ICD-10-CM | POA: Diagnosis not present

## 2017-11-14 DIAGNOSIS — Z6833 Body mass index (BMI) 33.0-33.9, adult: Secondary | ICD-10-CM | POA: Diagnosis not present

## 2017-11-14 DIAGNOSIS — Z1389 Encounter for screening for other disorder: Secondary | ICD-10-CM | POA: Diagnosis not present

## 2018-04-21 DIAGNOSIS — R55 Syncope and collapse: Secondary | ICD-10-CM | POA: Diagnosis not present

## 2018-04-23 DIAGNOSIS — R51 Headache: Secondary | ICD-10-CM | POA: Diagnosis not present

## 2018-04-23 DIAGNOSIS — R55 Syncope and collapse: Secondary | ICD-10-CM | POA: Diagnosis not present

## 2018-05-05 DIAGNOSIS — R51 Headache: Secondary | ICD-10-CM | POA: Diagnosis not present

## 2018-05-05 DIAGNOSIS — R55 Syncope and collapse: Secondary | ICD-10-CM | POA: Diagnosis not present

## 2018-05-05 DIAGNOSIS — R0683 Snoring: Secondary | ICD-10-CM | POA: Diagnosis not present

## 2018-05-06 DIAGNOSIS — G4730 Sleep apnea, unspecified: Secondary | ICD-10-CM | POA: Diagnosis not present

## 2018-05-26 ENCOUNTER — Ambulatory Visit: Payer: Managed Care, Other (non HMO)

## 2018-06-06 ENCOUNTER — Encounter: Payer: Self-pay | Admitting: Cardiology

## 2018-06-06 ENCOUNTER — Ambulatory Visit: Payer: 59 | Admitting: Cardiology

## 2018-06-06 VITALS — BP 131/85 | HR 69 | Ht 73.0 in | Wt 257.0 lb

## 2018-06-06 DIAGNOSIS — R55 Syncope and collapse: Secondary | ICD-10-CM | POA: Diagnosis not present

## 2018-06-06 DIAGNOSIS — R9431 Abnormal electrocardiogram [ECG] [EKG]: Secondary | ICD-10-CM

## 2018-06-06 DIAGNOSIS — R42 Dizziness and giddiness: Secondary | ICD-10-CM

## 2018-06-06 NOTE — Progress Notes (Signed)
Clinical Summary Mr. [name] is a 57 y.o.male seen today as new consult, referred by PA [name] for syncope.   1. Syncope - about 2 months ago, isolated episode - had just gotten off work, driving and got to stop sign. Next thing he remembers he was at the high school 2 blocks. Occurred around 415 pm. Had headache at the time. No prodrome. If looks down gets dizzy, which is common for him.  - CT head [facility] 04/2018 no acute process  - denies any palpitations. Mild chest pains after meals sometimes at night. No SOB or DOE - highest activity is cutting grass with push mower, takes about. Can go over 1 hr without symptoms.  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     Drinks 8 small bottles. Limited caffeine. Rare EtOH. Occasional orthostatic symptoms  SH: works at [employer].   Past Medical History:  Diagnosis Date  . Chronic back pain   . Elevated ferritin level 09/09/2012  . Erythrocytosis 09/09/2012  . GERD (gastroesophageal reflux disease)   . Lumbar disc disease      No Known Allergies   Current Outpatient Medications  Medication Sig Dispense Refill  . clotrimazole-betamethasone (LOTRISONE) cream Apply 1 application topically daily.    Marland Kitchen ibuprofen (ADVIL,MOTRIN) 800 MG tablet Take 800 mg by mouth every 8 (eight) hours as needed.      . methocarbamol (ROBAXIN) 500 MG tablet Take 500 mg by mouth 3 (three) times daily.    . simvastatin (ZOCOR) 20 MG tablet Take 20 mg by mouth every evening.     No current facility-administered medications for this visit.      Past Surgical History:  Procedure Laterality Date  . LIPOMA EXCISION       No Known Allergies    Family History  Problem Relation Age of Onset  . Coronary artery disease Mother   . Diabetes type II Sister      Social History Mr. [name] reports that he has never smoked. He has never used smokeless tobacco. Mr. [name] reports that he drinks alcohol.   Review of  Systems CONSTITUTIONAL: No weight loss, fever, chills, weakness or fatigue.  HEENT: Eyes: No visual loss, blurred vision, double vision or yellow sclerae.No hearing loss, sneezing, congestion, runny nose or sore throat.  SKIN: No rash or itching.  CARDIOVASCULAR: per hpi RESPIRATORY: No shortness of breath, cough or sputum.  GASTROINTESTINAL: No anorexia, nausea, vomiting or diarrhea. No abdominal pain or blood.  GENITOURINARY: No burning on urination, no polyuria NEUROLOGICAL:dizzienss with standing MUSCULOSKELETAL: No muscle, back pain, joint pain or stiffness.  LYMPHATICS: No enlarged nodes. No history of splenectomy.  PSYCHIATRIC: No history of depression or anxiety.  ENDOCRINOLOGIC: No reports of sweating, cold or heat intolerance. No polyuria or polydipsia.  Marland Kitchen   Physical Examination Vitals:   06/06/18 0848  BP: 131/85  Pulse: 69  SpO2: 98%   Vitals:   06/06/18 0848  Weight: 257 lb (116.6 kg)  Height: 6\' 1"  (1.854 m)    Gen: resting comfortably, no acute distress HEENT: no scleral icterus, pupils equal round and reactive, no palptable cervical adenopathy,  CV: RRR, no m/r/g, no jvd Resp: Clear to auscultation bilaterally GI: abdomen is soft, non-tender, non-distended, normal bowel sounds, no hepatosplenomegaly MSK: extremities are warm, no edema.  Skin: warm, no rash Neuro:  no focal deficits Psych: appropriate affect    Assessment and Plan  1. Possible syncope - vague unclear episode,  possible syncope. No recurrent episode in 2 months - unclear if any cardiac component. We will obtain an echo and 2 week event monitor - orthostatic symptoms at times, history doesn't support orthostatic syncope. Orthostatics negative in clinic, SBP drops 14 points with standing. Encouraged increased hydration        Arnoldo Lenis, M.D.

## 2018-06-06 NOTE — Patient Instructions (Signed)
Medication Instructions:  Your physician recommends that you continue on your current medications as directed. Please refer to the Current Medication list given to you today.   Labwork: none  Testing/Procedures: Your physician has recommended that you wear an event monitor. Event monitors are medical devices that record the heart's electrical activity. Doctors most often [place] these monitors to diagnose arrhythmias. Arrhythmias are problems with the speed or rhythm of the heartbeat. The monitor is a small, portable device. You can wear one while you do your normal daily activities. This is usually used to diagnose what is causing palpitations/syncope (passing out).  Your physician has requested that you have an echocardiogram. Echocardiography is a painless test that uses sound waves to create images of your heart. It provides your doctor with information about the size and shape of your heart and how well your heart's chambers and valves are working. This procedure takes approximately one hour. There are no restrictions for this procedure.    Follow-Up: Your physician recommends that you schedule a follow-up appointment in: 2 months    Any Other Special Instructions Will Be Listed Below (If Applicable).     If you need a refill on your cardiac medications before your next appointment, please call your pharmacy.

## 2018-06-12 ENCOUNTER — Other Ambulatory Visit (HOSPITAL_COMMUNITY): Payer: 59

## 2018-06-30 ENCOUNTER — Ambulatory Visit (INDEPENDENT_AMBULATORY_CARE_PROVIDER_SITE_OTHER): Payer: Self-pay

## 2018-06-30 DIAGNOSIS — Z1211 Encounter for screening for malignant neoplasm of colon: Secondary | ICD-10-CM

## 2018-06-30 MED ORDER — NA SULFATE-K SULFATE-MG SULF 17.5-3.13-1.6 GM/177ML PO SOLN: 1.0000 | ORAL | 0 refills | Status: DC

## 2018-06-30 NOTE — Patient Instructions (Signed)
[name]  26-Feb-1961 MRN: [id number]     Procedure Date: 09/10/18 Time to register: 8:30am Place to register: [facility] Procedure Time: 9:30am Scheduled provider: R. Garfield Cornea, MD    PREPARATION FOR COLONOSCOPY WITH SUPREP BOWEL PREP KIT  Note: Suprep Bowel Prep Kit is a split-dose (2day) regimen. Consumption of BOTH 6-ounce bottles is required for a complete prep.  Please notify us immediately if you are diabetic, take iron supplements, or if you are on Coumadin or any other blood thinners.                                                                                                                                            2 DAYS BEFORE PROCEDURE:  DATE: 09/08/18   DAY: Monday Begin clear liquid diet AFTER your lunch meal. NO SOLID FOODS after this point.  1 DAY BEFORE PROCEDURE:  DATE: 09/09/18   DAY: Tuesday Continue clear liquids the entire day - NO SOLID FOOD.    At 6:00pm: Complete steps 1 through 4 below, using ONE (1) 6-ounce bottle, before going to bed. Step 1:  Pour ONE (1) 6-ounce bottle of SUPREP liquid into the mixing container.  Step 2:  Add cool drinking water to the 16 ounce line on the container and mix.  Note: Dilute the solution concentrate as directed prior to use. Step 3:  DRINK ALL the liquid in the container. Step 4:  You MUST drink an additional two (2) or more 16 ounce containers of water over the next one (1) hour.   Continue clear liquids.  DAY OF PROCEDURE:   DATE: 09/10/18   DAY: Wednesday If you take medications for your heart, blood pressure, or breathing, you may take these medications.   5 hours before your procedure at :4:30am Step 1:  Pour ONE (1) 6-ounce bottle of SUPREP liquid into the mixing container.  Step 2:  Add cool drinking water to the 16 ounce line on the container and mix.  Note: Dilute the solution concentrate as directed prior to use. Step 3:  DRINK ALL the liquid in the container. Step 4:  You MUST drink  an additional two (2) or more 16 ounce containers of water over the next one (1) hour. You MUST complete the final glass of water at least 3 hours before your colonoscopy.   Nothing by mouth past:6:30am  You may take your morning medications with sip of water unless we have instructed otherwise.    Please see below for Dietary Information.  CLEAR LIQUIDS INCLUDE:  Water Jello (NOT red in color)   Ice Popsicles (NOT red in color)   Tea (sugar ok, no milk/cream) Powdered fruit flavored drinks  Coffee (sugar ok, no milk/cream) Gatorade/ Lemonade/ Kool-Aid  (NOT red in color)   Juice: apple, white grape, white cranberry Soft drinks  Clear bullion, consomme, broth (fat free  beef/chicken/vegetable)  Carbonated beverages (any kind)  Strained chicken noodle soup Hard Candy   Remember: Clear liquids are liquids that will allow you to see your fingers on the other side of a clear glass. Be sure liquids are NOT red in color, and not cloudy, but CLEAR.  DO NOT EAT OR DRINK ANY OF THE FOLLOWING:  Dairy products of any kind   Cranberry juice Tomato juice / V8 juice   Grapefruit juice Orange juice     Red grape juice  Do not eat any solid foods, including such foods as: cereal, oatmeal, yogurt, fruits, vegetables, creamed soups, eggs, bread, crackers, pureed foods in a blender, etc.   HELPFUL HINTS FOR DRINKING PREP SOLUTION:   Make sure prep is extremely cold. Mix and refrigerate the the morning of the prep. You may also put in the freezer.   You may try mixing some Crystal Light or Country Time Lemonade if you prefer. Mix in small amounts; add more if necessary.  Try drinking through a straw  Rinse mouth with water or a mouthwash between glasses, to remove after-taste.  Try sipping on a cold beverage /ice/ popsicles between glasses of prep.  Place a piece of sugar-free hard candy in mouth between glasses.  If you become nauseated, try consuming smaller amounts, or stretch out the time  between glasses. Stop for 30-60 minutes, then slowly start back drinking.     OTHER INSTRUCTIONS  You will need a responsible adult at least 59 years of age to accompany you and drive you home. This person must remain in the waiting room during your procedure. The hospital will cancel your procedure if you do not have a responsible adult with you.   1. Wear loose fitting clothing that is easily removed. 2. Leave jewelry and other valuables at home.  3. Remove all body piercing jewelry and leave at home. 4. Total time from sign-in until discharge is approximately 2-3 hours. 5. You should go home directly after your procedure and rest. You can resume normal activities the day after your procedure. 6. The day of your procedure you should not:  Drive  Make legal decisions  Operate machinery  Drink alcohol  Return to work   You may call the office (Dept: [phone]) before 5:00pm, or page the doctor on call [phone]) after 5:00pm, for further instructions, if necessary.   Insurance Information YOU WILL NEED TO CHECK WITH YOUR INSURANCE COMPANY FOR THE BENEFITS OF COVERAGE YOU HAVE FOR THIS PROCEDURE.  UNFORTUNATELY, NOT ALL INSURANCE COMPANIES HAVE BENEFITS TO COVER ALL OR PART OF THESE TYPES OF PROCEDURES.  IT IS YOUR RESPONSIBILITY TO CHECK YOUR BENEFITS, HOWEVER, WE WILL BE GLAD TO ASSIST YOU WITH ANY CODES YOUR INSURANCE COMPANY MAY NEED.    PLEASE NOTE THAT MOST INSURANCE COMPANIES WILL NOT COVER A SCREENING COLONOSCOPY FOR PEOPLE UNDER THE AGE OF 50  IF YOU HAVE BCBS INSURANCE, YOU MAY HAVE BENEFITS FOR A SCREENING COLONOSCOPY BUT IF POLYPS ARE FOUND THE DIAGNOSIS WILL CHANGE AND THEN YOU MAY HAVE A DEDUCTIBLE THAT WILL NEED TO BE MET. SO PLEASE MAKE SURE YOU CHECK YOUR BENEFITS FOR A SCREENING COLONOSCOPY AS WELL AS A DIAGNOSTIC COLONOSCOPY.

## 2018-06-30 NOTE — Progress Notes (Signed)
Gastroenterology Pre-Procedure Review  Request Date:06/30/18 Requesting Physician: Rowan Blase Kennedy no previous tcs Pt said it didn't matter to him which MD did his procedure. He would like to have it done before 07/23/18 if possible, due to his insurance. We do not have anything available, I advised the pt that I would call him if anyone cancels.   PATIENT REVIEW QUESTIONS: The patient responded to the following health history questions as indicated:    1. Diabetes Melitis: no 2. Joint replacements in the past 12 months: no 3. Major health problems in the past 3 months: no 4. Has an artificial valve or MVP: no 5. Has a defibrillator: no 6. Has been advised in past to take antibiotics in advance of a procedure like teeth cleaning: no 7. Family history of colon cancer: no  8. Alcohol Use: no 9. History of sleep apnea: no  10. History of coronary artery or other vascular stents placed within the last 12 months: no 11. History of any prior anesthesia complications: no    MEDICATIONS & ALLERGIES:    Patient reports the following regarding taking any blood thinners:   Plavix? no Aspirin? no Coumadin? no Brilinta? no Xarelto? no Eliquis? no Pradaxa? no Savaysa? no Effient? no  Patient confirms/reports the following medications:  Current Outpatient Medications  Medication Sig Dispense Refill  . ibuprofen (ADVIL,MOTRIN) 800 MG tablet Take 800 mg by mouth every 8 (eight) hours as needed.       No current facility-administered medications for this visit.     Patient confirms/reports the following allergies:  No Known Allergies  No orders of the defined types were placed in this encounter.   AUTHORIZATION INFORMATION Primary Insurance: Christella Scheuermann  ID #: [id number] Pre-Cert / Josem Kaufmann required: no   SCHEDULE INFORMATION: Procedure has been scheduled as follows:  Date: 09/10/18, Time: 9:30 Location: [facility]  This Gastroenterology Pre-Precedure Review Form is being routed to  the following provider(s): Roseanne Kaufman NP

## 2018-07-01 NOTE — Progress Notes (Signed)
Appropriate.

## 2018-07-02 NOTE — Progress Notes (Addendum)
We had some cancellations on Friday 07/04/18. I called the pt and he agreed to move his procedure. We went over the dates and times for his instructions and the fact that he needs to be on a clear liquid diet the rest of today and all day tomorrow and do his prep tomorrow. He is also aware of what time he needs to be at the hospital. Pt stated he understood all of his instructions. I called endo- LM and asked them to move his procedure.

## 2018-07-04 ENCOUNTER — Ambulatory Visit (HOSPITAL_COMMUNITY)
Admission: RE | Admit: 2018-07-04 | Discharge: 2018-07-04 | Disposition: A | Discharge status: Home / Self Care | Payer: 59 | Attending: Internal Medicine | Admitting: Internal Medicine

## 2018-07-04 ENCOUNTER — Other Ambulatory Visit: Payer: Self-pay

## 2018-07-04 ENCOUNTER — Encounter (HOSPITAL_COMMUNITY): Payer: Self-pay | Admitting: *Deleted

## 2018-07-04 ENCOUNTER — Encounter (HOSPITAL_COMMUNITY)
Admission: RE | Disposition: A | Discharge status: Home / Self Care | Payer: Self-pay | Source: Home / Self Care | Attending: Internal Medicine

## 2018-07-04 DIAGNOSIS — K5730 Diverticulosis of large intestine without perforation or abscess without bleeding: Secondary | ICD-10-CM

## 2018-07-04 DIAGNOSIS — D122 Benign neoplasm of ascending colon: Secondary | ICD-10-CM

## 2018-07-04 DIAGNOSIS — Z1211 Encounter for screening for malignant neoplasm of colon: Secondary | ICD-10-CM

## 2018-07-04 HISTORY — PX: COLONOSCOPY: SHX5424

## 2018-07-04 HISTORY — PX: POLYPECTOMY: SHX5525

## 2018-07-04 SURGERY — COLONOSCOPY
Anesthesia: Moderate Sedation

## 2018-07-04 MED ORDER — MEPERIDINE HCL 100 MG/ML IJ SOLN
INTRAMUSCULAR | Status: DC | PRN
  Administered 2018-07-04: 25 mg
  Administered 2018-07-04: 15 mg

## 2018-07-04 MED ORDER — ONDANSETRON HCL 4 MG/2ML IJ SOLN
INTRAMUSCULAR | Status: AC
  Filled 2018-07-04: qty 2

## 2018-07-04 MED ORDER — SODIUM CHLORIDE 0.9 % IV SOLN
INTRAVENOUS | Status: DC
  Administered 2018-07-04: 1000 mL via INTRAVENOUS

## 2018-07-04 MED ORDER — STERILE WATER FOR IRRIGATION IR SOLN
Status: DC | PRN
  Administered 2018-07-04: 09:00:00

## 2018-07-04 MED ORDER — ONDANSETRON HCL 4 MG/2ML IJ SOLN
INTRAMUSCULAR | Status: DC | PRN
  Administered 2018-07-04: 4 mg via INTRAVENOUS

## 2018-07-04 MED ORDER — MEPERIDINE HCL 50 MG/ML IJ SOLN
INTRAMUSCULAR | Status: AC
  Filled 2018-07-04: qty 1

## 2018-07-04 MED ORDER — MIDAZOLAM HCL 5 MG/5ML IJ SOLN
INTRAMUSCULAR | Status: DC | PRN
  Administered 2018-07-04: 2 mg via INTRAVENOUS
  Administered 2018-07-04 (×2): 1 mg via INTRAVENOUS

## 2018-07-04 MED ORDER — MIDAZOLAM HCL 5 MG/5ML IJ SOLN
INTRAMUSCULAR | Status: AC
  Filled 2018-07-04: qty 10

## 2018-07-04 NOTE — Op Note (Signed)
[facility] Patient Name: [name] Procedure Date: 07/04/2018 9:00 AM MRN: [id number] Date of Birth: 1960-12-28 Attending MD: Norvel Richards , MD CSN: [id number] Age: 57 Admit Type: Outpatient Procedure:                Colonoscopy Indications:              Screening for colorectal malignant neoplasm Providers:                Norvel Richards, MD, Janeece Riggers, RN, Randa Spike, Technician, Nelma Rothman, Technician Referring MD:              Medicines:                Midazolam 4 mg IV, Meperidine 40 mg IV Complications:            No immediate complications. Estimated Blood Loss:     Estimated blood loss: minimal Procedure:                Pre-Anesthesia Assessment:                           - Prior to the procedure, a History and Physical                            was performed, and patient medications and                            allergies were reviewed. The patient's tolerance of                            previous anesthesia was also reviewed. The risks                            and benefits of the procedure and the sedation                            options and risks were discussed with the patient.                            All questions were answered, and informed consent                            was obtained. ASA Grade Assessment: II - A patient                            with mild systemic disease. After reviewing the                            risks and benefits, the patient was deemed in                            satisfactory condition to undergo the procedure.  After obtaining informed consent, the colonoscope                            was passed under direct vision. Throughout the                            procedure, the patient's blood pressure, pulse, and                            oxygen saturations were monitored continuously. The                            CF-[id number] ([id number]) scope was  introduced through                            the anus and advanced to the the cecum, identified                            by appendiceal orifice and ileocecal valve. The                            colonoscopy was performed without difficulty. The                            patient tolerated the procedure well. The quality                            of the bowel preparation was adequate. The                            colonoscopy was performed without difficulty. The                            patient tolerated the procedure well. The quality                            of the bowel preparation was adequate. Scope In: 9:19:39 AM Scope Out: 9:36:20 AM Scope Withdrawal Time: 0 hours 13 minutes 43 seconds  Total Procedure Duration: 0 hours 16 minutes 41 seconds  Findings:      The perianal and digital rectal examinations were normal.      Scattered medium-mouthed diverticula were found in the sigmoid colon and       descending colon.      Three sessile polyps were found in the ascending colon. The polyps were       3 to 4 mm in size. These polyps were removed with a cold snare.       Resection and retrieval were complete. Estimated blood loss was minimal.      The exam was otherwise without abnormality on direct and retroflexion       views. Impression:               - Diverticulosis in the sigmoid colon and in the  descending colon.                           - Three 3 to 4 mm polyps in the ascending colon,                            removed with a cold snare. Resected and retrieved.                           - The examination was otherwise normal on direct                            and retroflexion views. Moderate Sedation:      Moderate (conscious) sedation was administered by the endoscopy nurse       and supervised by the endoscopist. The following parameters were       monitored: oxygen saturation, heart rate, blood pressure, respiratory       rate, EKG,  adequacy of pulmonary ventilation, and response to care.       Total physician intraservice time was 22 minutes. Recommendation:           - Patient has a contact number available for                            emergencies. The signs and symptoms of potential                            delayed complications were discussed with the                            patient. Return to normal activities tomorrow.                            Written discharge instructions were provided to the                            patient.                           - Advance diet as tolerated.                           - Continue present medications.                           - Repeat colonoscopy date to be determined after                            pending pathology results are reviewed for                            surveillance.                           - Return to GI office (date not yet determined). Procedure Code(s):        --- Professional ---  45385, Colonoscopy, flexible; with removal of                            tumor(s), polyp(s), or other lesion(s) by snare                            technique                           G0500, Moderate sedation services provided by the                            same physician or other qualified health care                            professional performing a gastrointestinal                            endoscopic service that sedation supports,                            requiring the presence of an independent trained                            observer to assist in the monitoring of the                            patient's level of consciousness and physiological                            status; initial 15 minutes of intra-service time;                            patient age 51 years or older (additional time may                            be reported with [phone], as appropriate) Diagnosis Code(s):        --- Professional ---                            Z12.11, Encounter for screening for malignant                            neoplasm of colon                           D12.2, Benign neoplasm of ascending colon                           K57.30, Diverticulosis of large intestine without                            perforation or abscess without bleeding CPT copyright 2018 American Medical Association. All rights reserved. The codes documented in this report are preliminary and upon coder review may  be revised to  meet current compliance requirements. Cristopher Estimable. Emilie Carp, MD Norvel Richards, MD 07/04/2018 9:41:33 AM This report has been signed electronically. Number of Addenda: 0

## 2018-07-04 NOTE — Discharge Instructions (Signed)
Colonoscopy Discharge Instructions  Read the instructions outlined below and refer to this sheet in the next few weeks. These discharge instructions provide you with general information on caring for yourself after you leave the hospital. Your doctor may also give you specific instructions. While your treatment has been planned according to the most current medical practices available, unavoidable complications occasionally occur. If you have any problems or questions after discharge, call Dr. Gala Romney at [phone]. ACTIVITY  You may resume your regular activity, but move at a slower pace for the next 24 hours.   Take frequent rest periods for the next 24 hours.   Walking will help get rid of the air and reduce the bloated feeling in your belly (abdomen).   No driving for 24 hours (because of the medicine (anesthesia) used during the test).    Do not sign any important legal documents or operate any machinery for 24 hours (because of the anesthesia used during the test).  NUTRITION  Drink plenty of fluids.   You may resume your normal diet as instructed by your doctor.   Begin with a light meal and progress to your normal diet. Heavy or fried foods are harder to digest and may make you feel sick to your stomach (nauseated).   Avoid alcoholic beverages for 24 hours or as instructed.  MEDICATIONS  You may resume your normal medications unless your doctor tells you otherwise.  WHAT YOU CAN EXPECT TODAY  Some feelings of bloating in the abdomen.   Passage of more gas than usual.   Spotting of blood in your stool or on the toilet paper.  IF YOU HAD POLYPS REMOVED DURING THE COLONOSCOPY:  No aspirin products for 7 days or as instructed.   No alcohol for 7 days or as instructed.   Eat a soft diet for the next 24 hours.  FINDING OUT THE RESULTS OF YOUR TEST Not all test results are available during your visit. If your test results are not back during the visit, make an appointment  with your caregiver to find out the results. Do not assume everything is normal if you have not heard from your caregiver or the medical facility. It is important for you to follow up on all of your test results.  SEEK IMMEDIATE MEDICAL ATTENTION IF:  You have more than a spotting of blood in your stool.   Your belly is swollen (abdominal distention).   You are nauseated or vomiting.   You have a temperature over 101.   You have abdominal pain or discomfort that is severe or gets worse throughout the day.    Colon polyp and diverticulosis information provided  Further recommendations to follow pending review of pathology report   Diverticulosis Diverticulosis is a condition that develops when small pouches (diverticula) form in the wall of the large intestine (colon). The colon is where water is absorbed and stool is formed. The pouches form when the inside layer of the colon pushes through weak spots in the outer layers of the colon. You may have a few pouches or many of them. What are the causes? The cause of this condition is not known. What increases the risk? The following factors may make you more likely to develop this condition:  Being older than age 49. Your risk for this condition increases with age. Diverticulosis is rare among people younger than age 38. By age 40, many people have it.  Eating a low-fiber diet.  Having frequent constipation.  Being overweight.  Not getting enough exercise.  Smoking.  Taking over-the-counter pain medicines, like aspirin and ibuprofen.  Having a family history of diverticulosis.  What are the signs or symptoms? In most people, there are no symptoms of this condition. If you do have symptoms, they may include:  Bloating.  Cramps in the abdomen.  Constipation or diarrhea.  Pain in the lower left side of the abdomen.  How is this diagnosed? This condition is most often diagnosed during an exam for other colon problems.  Because diverticulosis usually has no symptoms, it often cannot be diagnosed independently. This condition may be diagnosed by:  Using a flexible scope to examine the colon (colonoscopy).  Taking an X-ray of the colon after dye has been put into the colon (barium enema).  Doing a CT scan.  How is this treated? You may not need treatment for this condition if you have never developed an infection related to diverticulosis. If you have had an infection before, treatment may include:  Eating a high-fiber diet. This may include eating more fruits, vegetables, and grains.  Taking a fiber supplement.  Taking a live bacteria supplement (probiotic).  Taking medicine to relax your colon.  Taking antibiotic medicines.  Follow these instructions at home:  Drink 6-8 glasses of water or more each day to prevent constipation.  Try not to strain when you have a bowel movement.  If you have had an infection before: ? Eat more fiber as directed by your health care provider or your diet and nutrition specialist (dietitian). ? Take a fiber supplement or probiotic, if your health care provider approves.  Take over-the-counter and prescription medicines only as told by your health care provider.  If you were prescribed an antibiotic, take it as told by your health care provider. Do not stop taking the antibiotic even if you start to feel better.  Keep all follow-up visits as told by your health care provider. This is important. Contact a health care provider if:  You have pain in your abdomen.  You have bloating.  You have cramps.  You have not had a bowel movement in 3 days. Get help right away if:  Your pain gets worse.  Your bloating becomes very bad.  You have a fever or chills, and your symptoms suddenly get worse.  You vomit.  You have bowel movements that are bloody or black.  You have bleeding from your rectum. Summary  Diverticulosis is a condition that develops when  small pouches (diverticula) form in the wall of the large intestine (colon).  You may have a few pouches or many of them.  This condition is most often diagnosed during an exam for other colon problems.  If you have had an infection related to diverticulosis, treatment may include increasing the fiber in your diet, taking supplements, or taking medicines. This information is not intended to replace advice given to you by your health care provider. Make sure you discuss any questions you have with your health care provider. Document Released: 04/05/2004 Document Revised: 05/28/2016 Document Reviewed: 05/28/2016 Elsevier Interactive Patient Education  2017 [place].  Colon Polyps Polyps are tissue growths inside the body. Polyps can grow in many places, including the large intestine (colon). A polyp may be a round bump or a mushroom-shaped growth. You could have one polyp or several. Most colon polyps are noncancerous (benign). However, some colon polyps can become cancerous over time. What are the causes? The exact cause of colon polyps is not known. What  known. °What increases the risk? °This condition is more likely to develop in people who: °· Have a family history of colon cancer or colon polyps. °· Are older than 50 or older than 45 if they are African American. °· Have inflammatory bowel disease, such as ulcerative colitis or Crohn disease. °· Are overweight. °· Smoke cigarettes. °· Do not get enough exercise. °· Drink too much alcohol. °· Eat a diet that is: °? High in fat and red meat. °? Low in fiber. °· Had childhood cancer that was treated with abdominal radiation. ° °What are the signs or symptoms? °Most polyps do not cause symptoms. If you have symptoms, they may include: °· Blood coming from your rectum when having a bowel movement. °· Blood in your stool. The stool may look dark red or black. °· A change in bowel habits, such as constipation or diarrhea. ° °How is this diagnosed? °This  condition is diagnosed with a colonoscopy. This is a procedure that uses a lighted, flexible scope to look at the inside of your colon. °How is this treated? °Treatment for this condition involves removing any polyps that are found. Those polyps will then be tested for cancer. If cancer is found, your health care provider will talk to you about options for colon cancer treatment. °Follow these instructions at home: °Diet °· Eat plenty of fiber, such as fruits, vegetables, and whole grains. °· Eat foods that are high in calcium and vitamin D, such as milk, cheese, yogurt, eggs, liver, fish, and broccoli. °· Limit foods high in fat, red meats, and processed meats, such as hot dogs, sausage, bacon, and lunch meats. °· Maintain a healthy weight, or lose weight if recommended by your health care provider. °General instructions °· Do not smoke cigarettes. °· Do not drink alcohol excessively. °· Keep all follow-up visits as told by your health care provider. This is important. This includes keeping regularly scheduled colonoscopies. Talk to your health care provider about when you need a colonoscopy. °· Exercise every day or as told by your health care provider. °Contact a health care provider if: °· You have new or worsening bleeding during a bowel movement. °· You have new or increased blood in your stool. °· You have a change in bowel habits. °· You unexpectedly lose weight. °This information is not intended to replace advice given to you by your health care provider. Make sure you discuss any questions you have with your health care provider. °Document Released: 04/04/2004 Document Revised: 12/15/2015 Document Reviewed: 05/30/2015 °Elsevier Interactive Patient Education © 2018 [employer] Inc. ° °

## 2018-07-04 NOTE — H&P (Signed)
_0 @   Primary Care Physician:  Ginger Organ Primary Gastroenterologist:  Dr. Gala Romney Pre-Procedure History & Physical: HPI:  [name] is a 57 y.o. male is here for a screening colonoscopy.   No GI symptoms.  No family history of colon cancer.  No prior colonoscopy.  Past Medical History:  Diagnosis Date  . Chronic back pain   . Elevated ferritin level 09/09/2012  . Erythrocytosis 09/09/2012  . GERD (gastroesophageal reflux disease)   . Lumbar disc disease     Past Surgical History:  Procedure Laterality Date  . LIPOMA EXCISION      Prior to Admission medications   Medication Sig Start Date End Date Taking? Authorizing Provider  Na Sulfate-K Sulfate-Mg Sulf (SUPREP BOWEL PREP KIT) 17.5-3.13-1.6 GM/177ML SOLN Take 1 kit by mouth as directed. 06/30/18  Yes Annitta Needs, NP  ibuprofen (ADVIL,MOTRIN) 800 MG tablet Take 800 mg by mouth every 8 (eight) hours as needed.      [provider]    Allergies as of 06/30/2018  . (No Known Allergies)    Family History  Problem Relation Age of Onset  . Coronary artery disease Mother   . Diabetes type II Sister   . Cancer Brother     Social History   Socioeconomic History  . Marital status: Legally Separated    Spouse name: Not on file  . Number of children: Not on file  . Years of education: Not on file  . Highest education level: Not on file  Occupational History  . Not on file  Social Needs  . Financial resource strain: Not on file  . Food insecurity:    Worry: Not on file    Inability: Not on file  . Transportation needs:    Medical: Not on file    Non-medical: Not on file  Tobacco Use  . Smoking status: Never Smoker  . Smokeless tobacco: Never Used  Substance and Sexual Activity  . Alcohol use: Yes    Comment: seldom  . Drug use: No  . Sexual activity: Not on file  Lifestyle  . Physical activity:    Days per week: Not on file    Minutes per session: Not on file  . Stress: Not on file   Relationships  . Social connections:    Talks on phone: Not on file    Gets together: Not on file    Attends religious service: Not on file    Active member of club or organization: Not on file    Attends meetings of clubs or organizations: Not on file    Relationship status: Not on file  . Intimate partner violence:    Fear of current or ex partner: Not on file    Emotionally abused: Not on file    Physically abused: Not on file    Forced sexual activity: Not on file  Other Topics Concern  . Not on file  Social History Narrative  . Not on file    Review of Systems: See HPI, otherwise negative ROS  Physical Exam: BP 130/83   Pulse 65   Temp 98.1 F (36.7 C) (Oral)   Resp 16   Ht _1  (1.854 m)   Wt 115.7 kg   SpO2 97%   BMI 33.64 kg/m  General:   Alert,  Well-developed, well-nourished, pleasant and cooperative in NAD Lungs:  Clear throughout to auscultation.   No wheezes, crackles, or rhonchi. No acute distress. Heart:  Regular rate  and rhythm; no murmurs, clicks, rubs,  or gallops. Abdomen:  Soft, nontender and nondistended. No masses, hepatosplenomegaly or hernias noted. Normal bowel sounds, without guarding, and without rebound.   Impression/Plan: [name] is now here to undergo a screening colonoscopy.  First ever average risk screening examination.  Risks, benefits, limitations, imponderables and alternatives regarding colonoscopy have been reviewed with the patient. Questions have been answered. All parties agreeable.     Notice:  This dictation was prepared with Dragon dictation along with smaller phrase technology. Any transcriptional errors that result from this process are unintentional and may not be corrected upon review.

## 2018-07-09 ENCOUNTER — Encounter (HOSPITAL_COMMUNITY): Payer: Self-pay | Admitting: Internal Medicine

## 2018-07-12 ENCOUNTER — Encounter: Payer: Self-pay | Admitting: Internal Medicine

## 2018-08-08 ENCOUNTER — Ambulatory Visit: Payer: Self-pay | Admitting: Student

## 2018-08-08 DIAGNOSIS — R0989 Other specified symptoms and signs involving the circulatory and respiratory systems: Secondary | ICD-10-CM

## 2018-08-08 NOTE — Progress Notes (Deleted)
Cardiology Office Note    Date:  08/08/2018   ID:  [name], DOB 04-Feb-1961, MRN [id number]  PCP:  Cory Munch, PA-C  Cardiologist: Carlyle Dolly, MD    No chief complaint on file.   History of Present Illness:    [name] is a 57 y.o. male with past medical history of chronic back pain but no prior cardiac history who presents to the office today for 30-monthfollow-up.  He was last examined by Dr. BHarl Bowiein 05/2018 as a new referral for syncope. The episode occurred while he was driving and he denied any associated palpitations or dyspnea at that time. He reported occasional episodes of chest discomfort when consuming supper but denied any exertional symptoms. He was able to push mow his lawn and load 25 pound boxes at work without any anginal symptoms. He denied any recurrent episodes since and an echocardiogram and 2-week event monitor were recommended for further assessment but it does not appear these were obtained.    Past Medical History:  Diagnosis Date  . Chronic back pain   . Elevated ferritin level 09/09/2012  . Erythrocytosis 09/09/2012  . GERD (gastroesophageal reflux disease)   . Lumbar disc disease     Past Surgical History:  Procedure Laterality Date  . COLONOSCOPY N/A 07/04/2018   Procedure: COLONOSCOPY;  Surgeon: RDaneil Dolin MD;  Location: [facility];  Service: Endoscopy;  Laterality: N/A;  9:30  . LIPOMA EXCISION    . POLYPECTOMY  07/04/2018   Procedure: POLYPECTOMY;  Surgeon: RDaneil Dolin MD;  Location: [facility];  Service: Endoscopy;;    Current Medications: Outpatient Medications Prior to Visit  Medication Sig Dispense Refill  . ibuprofen (ADVIL,MOTRIN) 800 MG tablet Take 800 mg by mouth every 8 (eight) hours as needed.      . Na Sulfate-K Sulfate-Mg Sulf (SUPREP BOWEL PREP KIT) 17.5-3.13-1.6 GM/177ML SOLN Take 1 kit by mouth as directed. 1 Bottle 0   No facility-administered medications prior to visit.       Allergies:   Patient has no known allergies.   Social History   Socioeconomic History  . Marital status: Legally Separated    Spouse name: Not on file  . Number of children: Not on file  . Years of education: Not on file  . Highest education level: Not on file  Occupational History  . Not on file  Social Needs  . Financial resource strain: Not on file  . Food insecurity:    Worry: Not on file    Inability: Not on file  . Transportation needs:    Medical: Not on file    Non-medical: Not on file  Tobacco Use  . Smoking status: Never Smoker  . Smokeless tobacco: Never Used  Substance and Sexual Activity  . Alcohol use: Yes    Comment: seldom  . Drug use: No  . Sexual activity: Not on file  Lifestyle  . Physical activity:    Days per week: Not on file    Minutes per session: Not on file  . Stress: Not on file  Relationships  . Social connections:    Talks on phone: Not on file    Gets together: Not on file    Attends religious service: Not on file    Active member of club or organization: Not on file    Attends meetings of clubs or organizations: Not on file    Relationship status: Not on file  Other  Topics Concern  . Not on file  Social History Narrative  . Not on file     Family History:  The patient's ***family history includes Cancer in his brother; Coronary artery disease in his mother; Diabetes type II in his sister.   Review of Systems:   Please see the history of present illness.     General:  No chills, fever, night sweats or weight changes.  Cardiovascular:  No chest pain, dyspnea on exertion, edema, orthopnea, palpitations, paroxysmal nocturnal dyspnea. Dermatological: No rash, lesions/masses Respiratory: No cough, dyspnea Urologic: No hematuria, dysuria Abdominal:   No nausea, vomiting, diarrhea, bright red blood per rectum, melena, or hematemesis Neurologic:  No visual changes, wkns, changes in mental status. All other systems reviewed and are  otherwise negative except as noted above.   Physical Exam:    VS:  There were no vitals taken for this visit.   General: Well developed, well nourished,male appearing in no acute distress. Head: Normocephalic, atraumatic, sclera non-icteric, no xanthomas, nares are without discharge.  Neck: No carotid bruits. JVD not elevated.  Lungs: Respirations regular and unlabored, without wheezes or rales.  Heart: ***Regular rate and rhythm. No S3 or S4.  No murmur, no rubs, or gallops appreciated. Abdomen: Soft, non-tender, non-distended with normoactive bowel sounds. No hepatomegaly. No rebound/guarding. No obvious abdominal masses. Msk:  Strength and tone appear normal for age. No joint deformities or effusions. Extremities: No clubbing or cyanosis. No edema.  Distal pedal pulses are 2+ bilaterally. Neuro: Alert and oriented X 3. Moves all extremities spontaneously. No focal deficits noted. Psych:  Responds to questions appropriately with a normal affect. Skin: No rashes or lesions noted  Wt Readings from Last 3 Encounters:  07/04/18 255 lb (115.7 kg)  06/06/18 257 lb (116.6 kg)  09/09/12 248 lb 4.8 oz (112.6 kg)        Studies/Labs Reviewed:   EKG:  EKG is*** ordered today.  The ekg ordered today demonstrates ***  Recent Labs: No results found for requested labs within last 8760 hours.   Lipid Panel No results found for: CHOL, TRIG, HDL, CHOLHDL, VLDL, LDLCALC, LDLDIRECT  Additional studies/ records that were reviewed today include:   Echocardiogram Stress Test: 08/2011 Baseline:  - Normal LA,LV, RA, RV and aorticsize . Borderline LVH. Normal MV and AV. Normal RV wall thickness. - LV global systolic function was normal. The estimated LV ejection fraction was 55% to 60%. - Normal wall motion; no LV regional wall motion abnormalities. Immediate post stress:  - LV size was normal and modestly but appropriately decreased from baseline. - LV global systolic function  was appropriately augmented from baseline. The estimated LV ejection fraction was 65% to 70%. - Normal wall motion; no LV regional wall motion abnormalities. - No evidence for new LV regional wall motion abnormalities.  Assessment:    No diagnosis found.   Plan:   In order of problems listed above:  1. ***    Medication Adjustments/Labs and Tests Ordered: Current medicines are reviewed at length with the patient today.  Concerns regarding medicines are outlined above.  Medication changes, Labs and Tests ordered today are listed in the Patient Instructions below. There are no Patient Instructions on file for this visit.   Signed, Erma Heritage, PA-C  08/08/2018 12:13 PM    [facility] [address] [place], [place] [address] Phone: [phone] Fax: [phone]

## 2019-02-02 DIAGNOSIS — R42 Dizziness and giddiness: Secondary | ICD-10-CM | POA: Diagnosis not present

## 2019-02-02 DIAGNOSIS — L989 Disorder of the skin and subcutaneous tissue, unspecified: Secondary | ICD-10-CM | POA: Diagnosis not present

## 2019-02-02 DIAGNOSIS — E6609 Other obesity due to excess calories: Secondary | ICD-10-CM | POA: Diagnosis not present

## 2019-02-02 DIAGNOSIS — Z6834 Body mass index (BMI) 34.0-34.9, adult: Secondary | ICD-10-CM | POA: Diagnosis not present

## 2019-02-02 DIAGNOSIS — Z1389 Encounter for screening for other disorder: Secondary | ICD-10-CM | POA: Diagnosis not present

## 2019-03-05 DIAGNOSIS — L723 Sebaceous cyst: Secondary | ICD-10-CM | POA: Diagnosis not present

## 2019-03-05 DIAGNOSIS — D485 Neoplasm of uncertain behavior of skin: Secondary | ICD-10-CM | POA: Diagnosis not present

## 2019-08-16 ENCOUNTER — Emergency Department (HOSPITAL_COMMUNITY): Payer: Self-pay

## 2019-08-16 ENCOUNTER — Encounter (HOSPITAL_COMMUNITY): Payer: Self-pay | Admitting: Emergency Medicine

## 2019-08-16 ENCOUNTER — Emergency Department (HOSPITAL_COMMUNITY)
Admission: EM | Admit: 2019-08-16 | Discharge: 2019-08-16 | Disposition: A | Discharge status: Home / Self Care | Payer: Self-pay | Attending: Emergency Medicine | Admitting: Emergency Medicine

## 2019-08-16 ENCOUNTER — Other Ambulatory Visit: Payer: Self-pay

## 2019-08-16 DIAGNOSIS — R05 Cough: Secondary | ICD-10-CM | POA: Insufficient documentation

## 2019-08-16 DIAGNOSIS — U071 COVID-19: Secondary | ICD-10-CM

## 2019-08-16 DIAGNOSIS — M7918 Myalgia, other site: Secondary | ICD-10-CM | POA: Insufficient documentation

## 2019-08-16 LAB — POC SARS CORONAVIRUS 2 AG -  ED: SARS Coronavirus 2 Ag: POSITIVE — AB

## 2019-08-16 NOTE — ED Provider Notes (Signed)
[facility] EMERGENCY DEPARTMENT Provider Note   CSN: [id number] Arrival date & time: 08/16/19  1941     History Chief Complaint  Patient presents with  . Chills    [name] is a 59 y.o. male with a past medical history of GERD presenting to the ED with a chief complaint of chills, body aches and cough.  Symptoms began yesterday.  Reports intermittent productive cough with mucus but denies any chest pain or shortness of breath.  States that he feels "like I have the flu, just all these bodyaches."  No sick contacts with similar symptoms or known COVID-19 exposures, chronic lung disease, leg swelling, vomiting, abdominal pain, diarrhea.  He has tried Tylenol with some improvement in his symptoms.  HPI     Past Medical History:  Diagnosis Date  . Chronic back pain   . Elevated ferritin level 09/09/2012  . Erythrocytosis 09/09/2012  . GERD (gastroesophageal reflux disease)   . Lumbar disc disease     Patient Active Problem List   Diagnosis Date Noted  . Erythrocytosis 09/09/2012  . Elevated ferritin level 09/09/2012  . Chest pain 08/15/2011  . Abnormal ECG 08/15/2011  . Blood pressure elevated without history of HTN 08/15/2011    Past Surgical History:  Procedure Laterality Date  . COLONOSCOPY N/A 07/04/2018   Procedure: COLONOSCOPY;  Surgeon: Daneil Dolin, MD;  Location: [facility];  Service: Endoscopy;  Laterality: N/A;  9:30  . LIPOMA EXCISION    . POLYPECTOMY  07/04/2018   Procedure: POLYPECTOMY;  Surgeon: Daneil Dolin, MD;  Location: [facility];  Service: Endoscopy;;       Family History  Problem Relation Age of Onset  . Coronary artery disease Mother   . Diabetes type II Sister   . Cancer Brother     Social History   Tobacco Use  . Smoking status: Never Smoker  . Smokeless tobacco: Never Used  Substance Use Topics  . Alcohol use: Yes    Comment: seldom  . Drug use: No    Home Medications Prior to Admission  medications   Medication Sig Start Date End Date Taking? Authorizing Provider  ibuprofen (ADVIL,MOTRIN) 800 MG tablet Take 800 mg by mouth every 8 (eight) hours as needed.      [provider]  Na Sulfate-K Sulfate-Mg Sulf (SUPREP BOWEL PREP KIT) 17.5-3.13-1.6 GM/177ML SOLN Take 1 kit by mouth as directed. 06/30/18   Annitta Needs, NP    Allergies    Patient has no known allergies.  Review of Systems   Review of Systems  Constitutional: Positive for chills and fatigue. Negative for fever.  HENT: Negative for sore throat.   Respiratory: Positive for cough.   Gastrointestinal: Negative for nausea and vomiting.  Musculoskeletal: Positive for myalgias.  Neurological: Negative for weakness.    Physical Exam Updated Vital Signs BP 130/88 (BP Location: Left Arm)   Pulse (!) 101   Temp 98.7 F (37.1 C) (Oral)   Resp 18   Ht _0  (1.854 m)   Wt 114.3 kg   SpO2 98%   BMI 33.25 kg/m   Physical Exam Vitals and nursing note reviewed.  Constitutional:      General: He is not in acute distress.    Appearance: He is well-developed. He is not diaphoretic.  HENT:     Head: Normocephalic and atraumatic.  Eyes:     General: No scleral icterus.    Conjunctiva/sclera: Conjunctivae normal.  Cardiovascular:  Rate and Rhythm: Normal rate and regular rhythm.     Heart sounds: Normal heart sounds.  Pulmonary:     Effort: Pulmonary effort is normal. No respiratory distress.     Breath sounds: Normal breath sounds.  Musculoskeletal:     Cervical back: Normal range of motion.  Skin:    Findings: No rash.  Neurological:     Mental Status: He is alert.     ED Results / Procedures / Treatments   Labs (all labs ordered are listed, but only abnormal results are displayed) Labs Reviewed  POC SARS CORONAVIRUS 2 AG -  ED - Abnormal; Notable for the following components:      Result Value   SARS Coronavirus 2 Ag POSITIVE (*)    All other components within normal limits     EKG None  Radiology DG Chest Portable 1 View  Result Date: 08/16/2019 CLINICAL DATA:  Cough. EXAM: PORTABLE CHEST 1 VIEW COMPARISON:  July 19, 2015 FINDINGS: There is no evidence of acute infiltrate. Very mild blunting of the left costophrenic angle is seen. No pneumothorax is identified. The heart size and mediastinal contours are within normal limits. The visualized skeletal structures are unremarkable. IMPRESSION: 1. Very small left pleural effusion versus pleural thickening. Electronically Signed   By: Virgina Norfolk M.D.   On: 08/16/2019 17:23    Procedures Procedures (including critical care time)  Medications Ordered in ED Medications - No data to display  ED Course  I have reviewed the triage vital signs and the nursing notes.  Pertinent labs & imaging results that were available during my care of the patient were reviewed by me and considered in my medical decision making (see chart for details).    MDM Rules/Calculators/A&P                      [name] was evaluated in Emergency Department on 08/16/19 for the symptoms described in the history of present illness. He/she was evaluated in the context of the global COVID-19 pandemic, which necessitated consideration that the patient might be at risk for infection with the SARS-CoV-2 virus that causes COVID-19. Institutional protocols and algorithms that pertain to the evaluation of patients at risk for COVID-19 are in a state of rapid change based on information released by regulatory bodies including the CDC and federal and state organizations. These policies and algorithms were followed during the patient's care in the ED.  59 year old male presenting to the ER myalgias, cough, chills since yesterday.  He denies any chest pain, shortness of breath, abdominal pain.  His vital signs are reassuring here.  His POC Covid test is positive.  Chest x-ray without any concerning findings.  He ambulated here with oxygen  saturations above 92% on room air and denied any shortness of breath or fatigue that worsened with ambulation.  I feel that he is suitable for outpatient follow-up and discharge home.  We will have him continue antipyretics.  I did give him strict return precautions regarding returning to the ED regarding his COVID-19 infection.  Patient is hemodynamically stable, in NAD, and able to ambulate in the ED. Evaluation does not show pathology that would require ongoing emergent intervention or inpatient treatment. I explained the diagnosis to the patient. Pain has been managed and has no complaints prior to discharge. Patient is comfortable with above plan and is stable for discharge at this time. All questions were answered prior to disposition. Strict return precautions for returning to  the ED were discussed. Encouraged follow up with PCP.   An After Visit Summary was printed and given to the patient.   Portions of this note were generated with [employer]. Dictation errors may occur despite best attempts at proofreading.  Final Clinical Impression(s) / ED Diagnoses Final diagnoses:  COVID-19 virus infection    Rx / DC Orders ED Discharge Orders    None       Delia Heady, PA-C 08/16/19 1810    Wyvonnia Dusky, MD 08/16/19 2101

## 2019-08-16 NOTE — Discharge Instructions (Addendum)
You can continue Tylenol and ibuprofen as needed for pain and body aches. Make sure you are drinking enough fluids. Return to the ED if you start to develop chest pain, shortness of breath, leg swelling, vomiting or coughing up blood or inability to tolerate anything by mouth.

## 2019-08-16 NOTE — ED Triage Notes (Signed)
Pt states he has been having chills, coughing and spitting up lots of phlegm since yesterday.

## 2019-08-16 NOTE — ED Notes (Signed)
Pt's O2 sat fluctuated between 92%-97% while sitting and ambulating around the room. Pt denied experiencing any ShOB while ambulating.

## 2019-08-17 DIAGNOSIS — J22 Unspecified acute lower respiratory infection: Secondary | ICD-10-CM | POA: Diagnosis not present

## 2019-08-17 DIAGNOSIS — U071 COVID-19: Secondary | ICD-10-CM | POA: Diagnosis not present

## 2019-11-02 ENCOUNTER — Ambulatory Visit: Payer: Self-pay | Attending: Internal Medicine

## 2019-11-02 DIAGNOSIS — Z23 Encounter for immunization: Secondary | ICD-10-CM

## 2019-11-02 NOTE — Progress Notes (Signed)
   Covid-19 Vaccination Clinic  Name:  [name]    MRN: [id number] DOB: May 16, 1961  11/02/2019  [name] was observed post Covid-19 immunization for 15 minutes without incident. He was provided with Vaccine Information Sheet and instruction to access the V-Safe system.   [name] was instructed to call 911 with any severe reactions post vaccine: Marland Kitchen Difficulty breathing  . Swelling of face and throat  . A fast heartbeat  . A bad rash all over body  . Dizziness and weakness   Immunizations Administered    Name Date Dose VIS Date Route   Pfizer COVID-19 Vaccine 11/02/2019 11:32 AM 0.3 mL 07/03/2019 Intramuscular   Manufacturer: [place]   Lot: [id number]   [place]: [id number]

## 2019-11-24 ENCOUNTER — Ambulatory Visit: Payer: Self-pay | Attending: Internal Medicine

## 2019-11-24 DIAGNOSIS — Z23 Encounter for immunization: Secondary | ICD-10-CM

## 2019-11-24 NOTE — Progress Notes (Signed)
   Covid-19 Vaccination Clinic  Name:  [name]    MRN: [id number] DOB: 03-23-1961  11/24/2019  [name] was observed post Covid-19 immunization for 15 minutes without incident. He was provided with Vaccine Information Sheet and instruction to access the V-Safe system.   [name] was instructed to call 911 with any severe reactions post vaccine: Marland Kitchen Difficulty breathing  . Swelling of face and throat  . A fast heartbeat  . A bad rash all over body  . Dizziness and weakness   Immunizations Administered    Name Date Dose VIS Date Route   Pfizer COVID-19 Vaccine 11/24/2019 11:26 AM 0.3 mL 09/16/2018 Intramuscular   Manufacturer: [place]   Lot: [id number]   [place]: [id number]

## 2021-12-12 ENCOUNTER — Other Ambulatory Visit: Payer: Self-pay

## 2021-12-12 ENCOUNTER — Emergency Department (HOSPITAL_COMMUNITY): Payer: 59

## 2021-12-12 ENCOUNTER — Emergency Department (HOSPITAL_COMMUNITY)
Admission: EM | Admit: 2021-12-12 | Discharge: 2021-12-12 | Disposition: A | Discharge status: Home / Self Care | Payer: 59 | Attending: Emergency Medicine | Admitting: Emergency Medicine

## 2021-12-12 DIAGNOSIS — D649 Anemia, unspecified: Secondary | ICD-10-CM | POA: Insufficient documentation

## 2021-12-12 DIAGNOSIS — R0789 Other chest pain: Secondary | ICD-10-CM | POA: Insufficient documentation

## 2021-12-12 DIAGNOSIS — D72819 Decreased white blood cell count, unspecified: Secondary | ICD-10-CM | POA: Diagnosis not present

## 2021-12-12 DIAGNOSIS — R079 Chest pain, unspecified: Secondary | ICD-10-CM

## 2021-12-12 LAB — BASIC METABOLIC PANEL
Anion gap: 6 (ref 5–15)
BUN: 13 mg/dL (ref 8–23)
CO2: 26 mmol/L (ref 22–32)
Calcium: 9 mg/dL (ref 8.9–10.3)
Chloride: 108 mmol/L (ref 98–111)
Creatinine, Ser: 1.25 mg/dL — ABNORMAL HIGH (ref 0.61–1.24)
GFR, Estimated: >60 mL/min (ref >60)
Glucose, Bld: 102 mg/dL — ABNORMAL HIGH (ref 70–99)
Potassium: 3.9 mmol/L (ref 3.5–5.1)
Sodium: 140 mmol/L (ref 135–145)

## 2021-12-12 LAB — CBC
HCT: 47.6 % (ref 39.0–52.0)
Hemoglobin: 14.5 g/dL (ref 13.0–17.0)
MCH: 21.4 pg — ABNORMAL LOW (ref 26.0–34.0)
MCHC: 30.5 g/dL (ref 30.0–36.0)
MCV: 70.3 fL — ABNORMAL LOW (ref 80.0–100.0)
Platelets: 151 K/uL (ref 150–400)
RBC: 6.77 MIL/uL — ABNORMAL HIGH (ref 4.22–5.81)
RDW: 17.6 % — ABNORMAL HIGH (ref 11.5–15.5)
WBC: 3.8 K/uL — ABNORMAL LOW (ref 4.0–10.5)
nRBC: 0 % (ref 0.0–0.2)

## 2021-12-12 LAB — D-DIMER, QUANTITATIVE: D-Dimer, Quant: <0.27 ug{FEU}/mL (ref 0.00–0.50)

## 2021-12-12 LAB — TROPONIN I (HIGH SENSITIVITY)
Troponin I (High Sensitivity): 7 ng/L (ref <18)
Troponin I (High Sensitivity): 6 ng/L (ref <18)

## 2021-12-12 MED ORDER — ASPIRIN 81 MG PO CHEW
324.0000 mg | CHEWABLE_TABLET | Freq: Once | ORAL | Status: AC
  Administered 2021-12-12: 324 mg via ORAL
  Filled 2021-12-12: qty 4

## 2021-12-12 MED ORDER — CYCLOBENZAPRINE HCL 10 MG PO TABS: 10.0000 mg | ORAL_TABLET | Freq: Two times a day (BID) | ORAL | 0 refills | Status: DC | PRN

## 2021-12-12 MED ORDER — IBUPROFEN 600 MG PO TABS: 600.0000 mg | ORAL_TABLET | Freq: Three times a day (TID) | ORAL | 0 refills | Status: AC | PRN

## 2021-12-12 MED ORDER — MORPHINE SULFATE (PF) 4 MG/ML IV SOLN
4.0000 mg | Freq: Once | INTRAVENOUS | Status: AC
  Administered 2021-12-12: 4 mg via INTRAVENOUS
  Filled 2021-12-12: qty 1

## 2021-12-12 NOTE — ED Provider Triage Note (Signed)
Emergency Medicine Provider Triage Evaluation Note  [name] , a 61 y.o. male  was evaluated in triage.  Pt complains of anterior chest pain that started while at work.  Says it is sharp and coming and going across the middle of his chest.  Says he has a history of a hiatal hernia but no other medical conditions.  Review of Systems  Positive: Chest pain Negative: Palpitations, shortness of breath  Physical Exam  BP 112/86 (BP Location: Left Arm)   Pulse 72   Temp 98.2 F (36.8 C) (Oral)   Resp 18   Ht '6\' 1"'$  (1.854 m)   Wt 113.9 kg   SpO2 98%   BMI 33.12 kg/m  Gen:   Awake, no distress   Resp:  Normal effort  MSK:   Moves extremities without difficulty  Other:  \Reproducible chest pain in the epigastrium as well as anterior chest over the sternum.  RRR, lung sounds clear  Medical Decision Making  Medically screening exam initiated at 10:42 AM.  Appropriate orders placed.  Zipporah Plants was informed that the remainder of the evaluation will be completed by another provider, this initial triage assessment does not replace that evaluation, and the importance of remaining in the ED until their evaluation is complete.     Rhae Hammock, PA-C 12/12/21 1043

## 2021-12-12 NOTE — Discharge Instructions (Signed)
Take the medications as needed for pain and discomfort.  Follow-up with your primary care doctor to be rechecked.  Return for increasing pain, worsening symptoms.

## 2021-12-12 NOTE — ED Triage Notes (Signed)
Pt. Stated, Im having chest pain all across , when I got out of car to work it started. The nurse at my job told me to come here. This happened 2 months ago.

## 2021-12-12 NOTE — ED Notes (Signed)
Pt ambulatory to waiting room. Pt verbalized understanding of discharge instructions.   

## 2021-12-12 NOTE — ED Provider Notes (Signed)
[facility] EMERGENCY DEPARTMENT Provider Note   CSN: [id number] Arrival date & time: 12/12/21  1012     History  Chief Complaint  Patient presents with   Chest Pain    [name] is a 61 y.o. male.   Chest Pain Associated symptoms: no fever    HPI: A 61 year old patient presents for evaluation of chest pain. Initial onset of pain was approximately 1-3 hours ago. The patient's chest pain is sharp and is not worse with exertion. The patient's chest pain is middle- or left-sided, is not well-localized, is not described as heaviness/pressure/tightness and does not radiate to the arms/jaw/neck. The patient does not complain of nausea and denies diaphoresis. The patient has no history of stroke, has no history of peripheral artery disease, has not smoked in the past 90 days, denies any history of treated diabetes, has no relevant family history of coronary artery disease (first degree relative at less than age 12), is not hypertensive, has no history of hypercholesterolemia and does not have an elevated BMI (>=30).  Patient has a history of back pain, GERD, erythrocytosis, elevated ferritin level. Home Medications Prior to Admission medications   Medication Sig Start Date End Date Taking? Authorizing Provider  cyclobenzaprine (FLEXERIL) 10 MG tablet Take 1 tablet (10 mg total) by mouth 2 (two) times daily as needed for muscle spasms. 12/12/21  Yes Dorie Rank, MD  ibuprofen (ADVIL) 600 MG tablet Take 1 tablet (600 mg total) by mouth every 8 (eight) hours as needed for up to 7 days for fever, headache, mild pain or moderate pain. 12/12/21 12/19/21 Yes Dorie Rank, MD  ibuprofen (ADVIL,MOTRIN) 800 MG tablet Take 800 mg by mouth every 8 (eight) hours as needed.      [provider]  Na Sulfate-K Sulfate-Mg Sulf (SUPREP BOWEL PREP KIT) 17.5-3.13-1.6 GM/177ML SOLN Take 1 kit by mouth as directed. 06/30/18   Annitta Needs, NP      Allergies    Patient has no known  allergies.    Review of Systems   Review of Systems  Constitutional:  Negative for fever.  Cardiovascular:  Positive for chest pain.   Physical Exam Updated Vital Signs BP 114/83   Pulse (!) 55   Temp 98.2 F (36.8 C) (Oral)   Resp 17   Ht 1.854 m (6' 1" )   Wt 113.9 kg   SpO2 99%   BMI 33.12 kg/m  Physical Exam Vitals and nursing note reviewed.  Constitutional:      General: He is not in acute distress.    Appearance: He is well-developed.  HENT:     Head: Normocephalic and atraumatic.     Right Ear: External ear normal.     Left Ear: External ear normal.  Eyes:     General: No scleral icterus.       Right eye: No discharge.        Left eye: No discharge.     Conjunctiva/sclera: Conjunctivae normal.  Neck:     Trachea: No tracheal deviation.  Cardiovascular:     Rate and Rhythm: Normal rate and regular rhythm.  Pulmonary:     Effort: Pulmonary effort is normal. No respiratory distress.     Breath sounds: Normal breath sounds. No stridor. No wheezing or rales.  Chest:     Chest wall: Tenderness present.     Comments: Tenderness palpation right chest wall, reproduces pain Abdominal:     General: Bowel sounds are normal. There is no  distension.     Palpations: Abdomen is soft.     Tenderness: There is no abdominal tenderness. There is no guarding or rebound.  Musculoskeletal:        General: No tenderness or deformity.     Cervical back: Neck supple.  Skin:    General: Skin is warm and dry.     Findings: No rash.  Neurological:     General: No focal deficit present.     Mental Status: He is alert.     Cranial Nerves: No cranial nerve deficit (no facial droop, extraocular movements intact, no slurred speech).     Sensory: No sensory deficit.     Motor: No abnormal muscle tone or seizure activity.     Coordination: Coordination normal.  Psychiatric:        Mood and Affect: Mood normal.    ED Results / Procedures / Treatments   Labs (all labs ordered are  listed, but only abnormal results are displayed) Labs Reviewed  BASIC METABOLIC PANEL - Abnormal; Notable for the following components:      Result Value   Glucose, Bld 102 (*)    Creatinine, Ser 1.25 (*)    All other components within normal limits  CBC - Abnormal; Notable for the following components:   WBC 3.8 (*)    RBC 6.77 (*)    MCV 70.3 (*)    MCH 21.4 (*)    RDW 17.6 (*)    All other components within normal limits  D-DIMER, QUANTITATIVE  TROPONIN I (HIGH SENSITIVITY)  TROPONIN I (HIGH SENSITIVITY)    EKG EKG Interpretation  Date/Time:  Tuesday Dec 12 2021 10:20:34 EDT Ventricular Rate:  69 PR Interval:  180 QRS Duration: 80 QT Interval:  406 QTC Calculation: 435 R Axis:   95 Text Interpretation: Normal sinus rhythm Rightward axis Inferior infarct , age undetermined Abnormal ECG When compared with ECG of 13-Dec-2010 15:56, Inferior Q waves noted on prior EKG, no acute changes Confirmed by Dorie Rank [phone]) on 12/12/2021 11:48:00 AM  Radiology DG Chest 2 View  Result Date: 12/12/2021 CLINICAL DATA:  Chest pain EXAM: CHEST - 2 VIEW COMPARISON:  08/15/2021 FINDINGS: The heart size and mediastinal contours are within normal limits. Both lungs are clear. Disc degenerative disease of the thoracic spine. IMPRESSION: No acute abnormality of the lungs. Electronically Signed   By: Delanna Ahmadi M.D.   On: 12/12/2021 10:52    Procedures .1-3 Lead EKG Interpretation Performed by: Dorie Rank, MD Authorized by: Dorie Rank, MD     Interpretation: normal     ECG rate:  60   ECG rate assessment: normal     Rhythm: sinus rhythm     Ectopy: none     Conduction: normal      Medications Ordered in ED Medications  morphine (PF) 4 MG/ML injection 4 mg (4 mg Intravenous Given 12/12/21 1153)  aspirin chewable tablet 324 mg (324 mg Oral Given 12/12/21 1154)    ED Course/ Medical Decision Making/ A&P Clinical Course as of 12/12/21 [place] Dec 12, 2021  1141 CBC(!) Decreased  white blood cell count, anemia stable [JK]  1142 DG Chest 2 View Chest x-ray images and radiology report reviewed.  No acute findings [JK]  1301 Troponin I (High Sensitivity) Normal [JK]  1301 D-dimer, quantitative Normal [JK]    Clinical Course User Index [JK] Dorie Rank, MD   HEAR Score: 1  Medical Decision Making Problems Addressed: Chest pain, unspecified type: complicated acute illness or injury  Amount and/or Complexity of Data Reviewed Labs: ordered. Decision-making details documented in ED Course. Radiology: ordered and independent interpretation performed. Decision-making details documented in ED Course. ECG/medicine tests: ordered and independent interpretation performed.  Risk OTC drugs. Prescription drug management.   Patient presented to the ED for evaluation of chest pain.  Differential included the possibility of pulmonary embolism, ACS, pneumonia, pneumothorax, chest wall pain.  ED work-up reassuring.  Serial troponins are normal.  D-dimer is negative.  I doubt acute coronary syndrome, pulmonary embolism at this time.  Patient does have reproducible chest wall tenderness.  Suspect etiology may be musculoskeletal in nature.  Evaluation and diagnostic testing in the emergency department does not suggest an emergent condition requiring admission or immediate intervention beyond what has been performed at this time.  The patient is safe for discharge and has been instructed to return immediately for worsening symptoms, change in symptoms or any other concerns.         Final Clinical Impression(s) / ED Diagnoses Final diagnoses:  Chest pain, unspecified type    Rx / DC Orders ED Discharge Orders          Ordered    ibuprofen (ADVIL) 600 MG tablet  Every 8 hours PRN        12/12/21 1537    cyclobenzaprine (FLEXERIL) 10 MG tablet  2 times daily PRN        12/12/21 1537              Dorie Rank, MD 12/12/21 1539

## 2022-10-27 IMAGING — DX DG CHEST 2V
2 series · 2 of 2 positions shown · non-contrast
Comparison: 08/15/2021

CLINICAL DATA: Chest pain

EXAM:
CHEST - 2 VIEW

[chest pa]
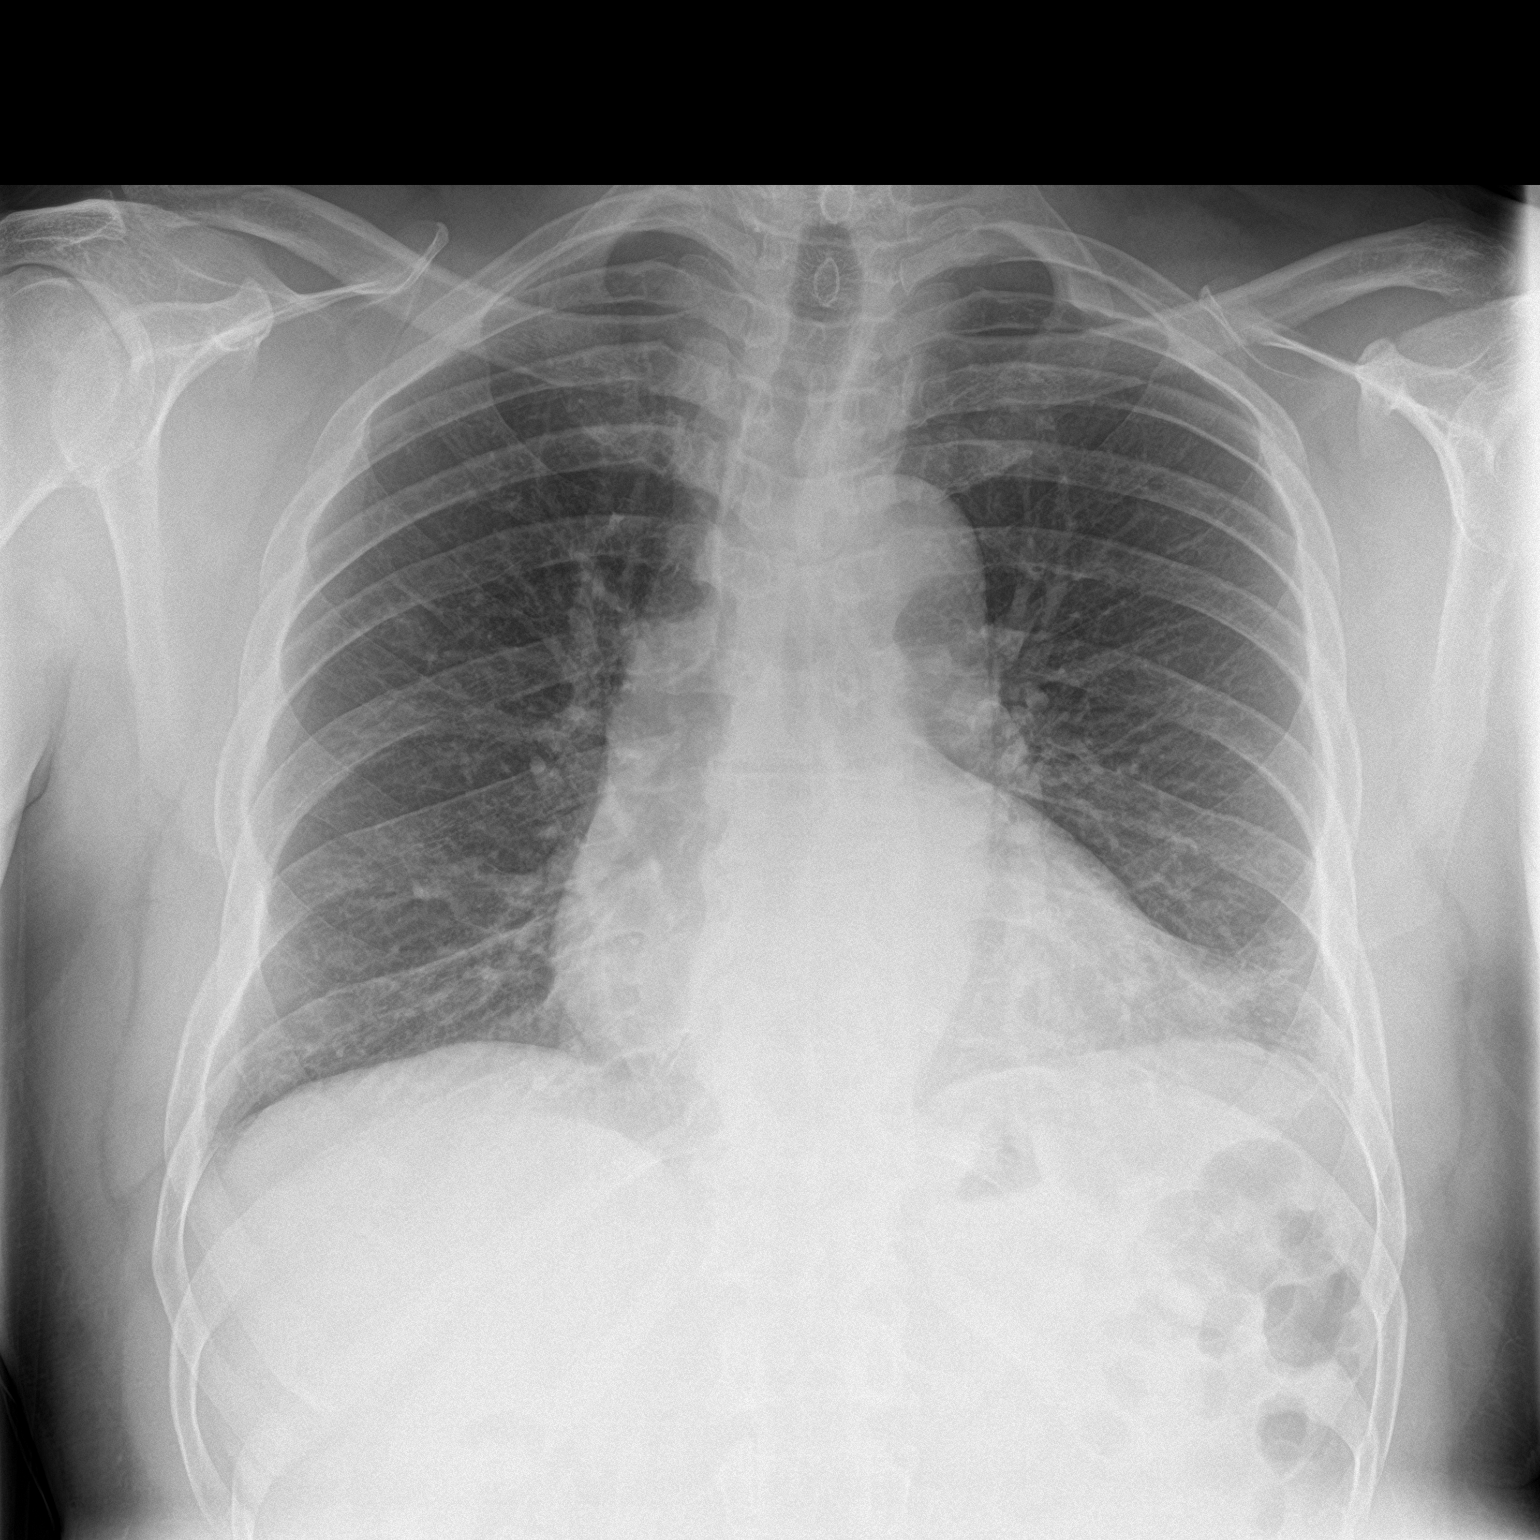

[chest lat]
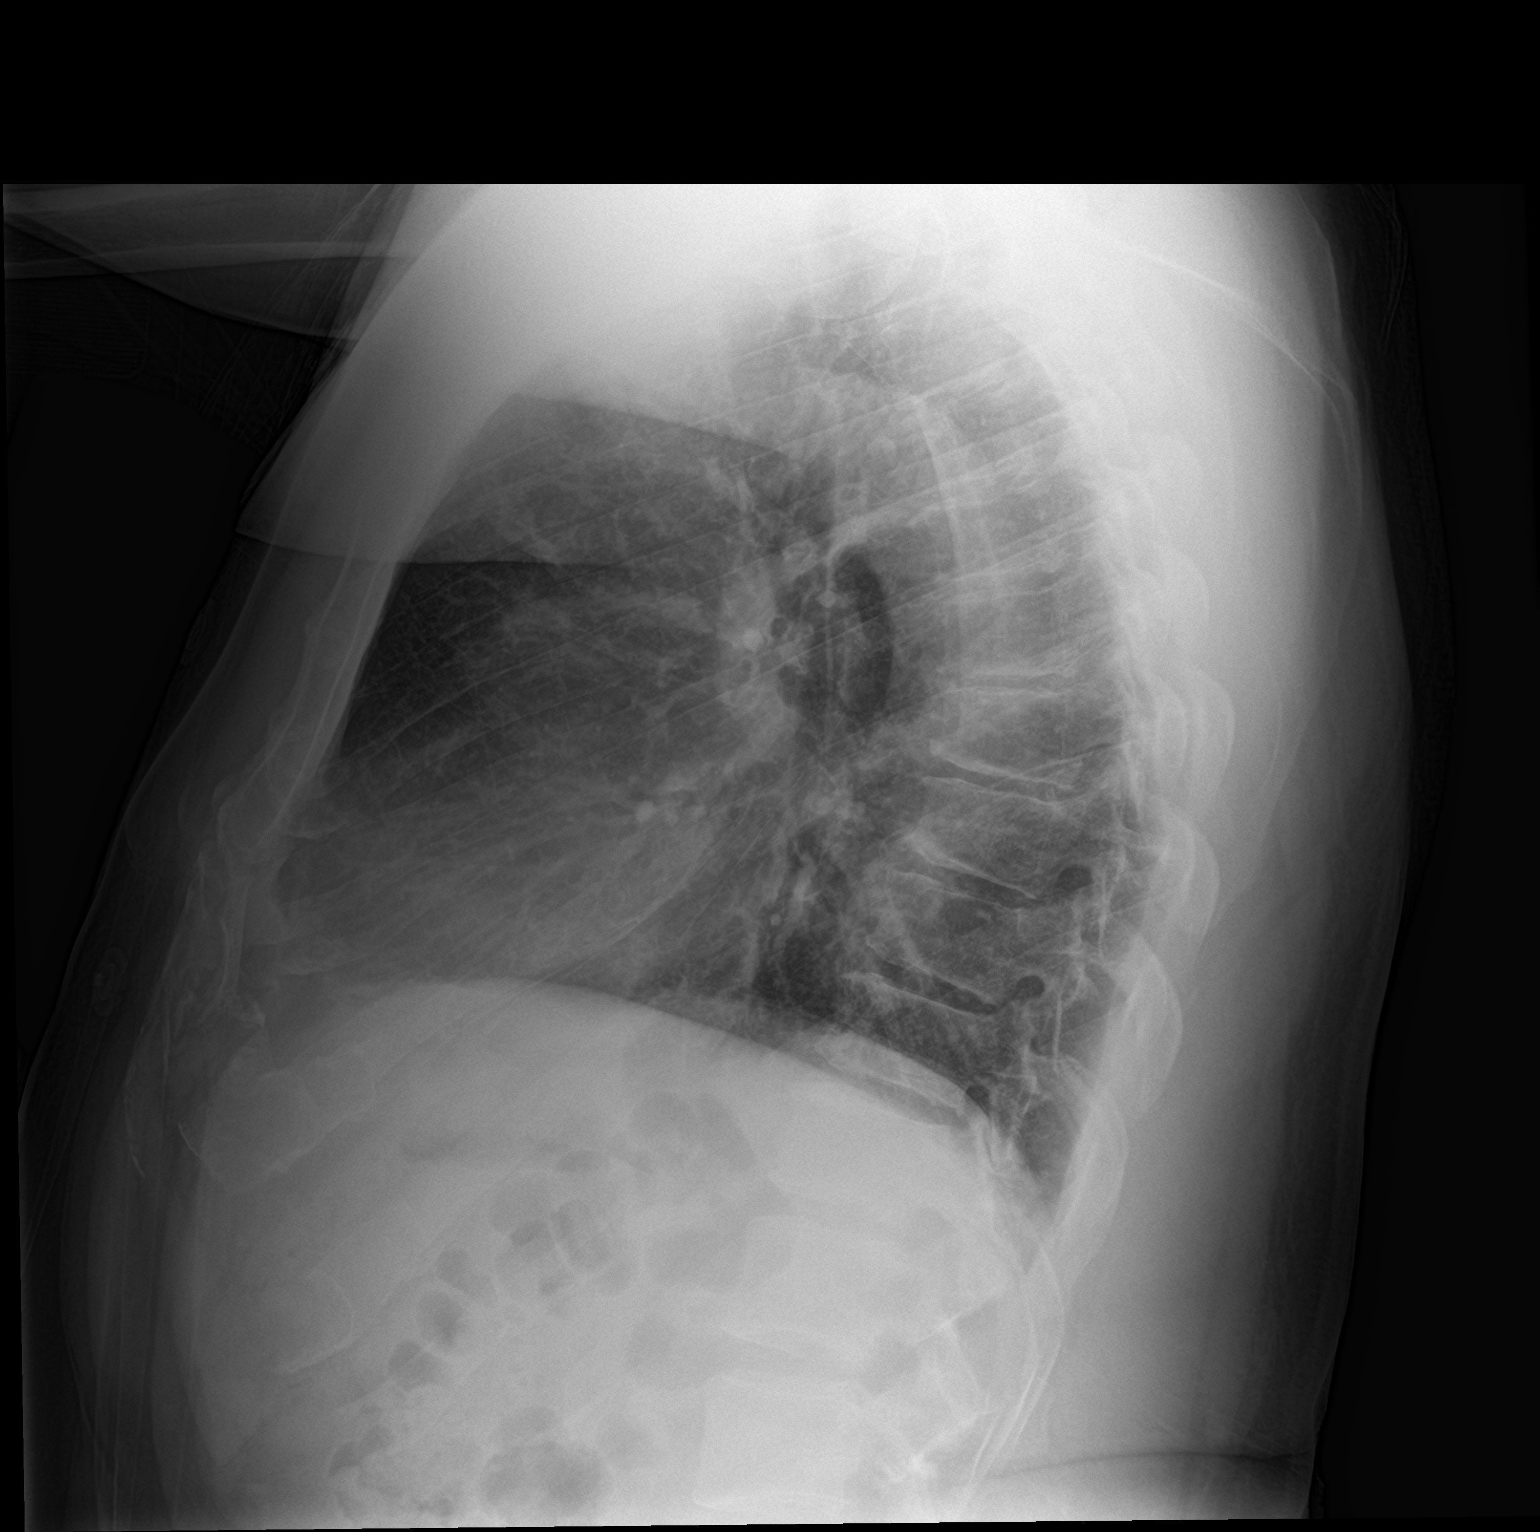

[2 of 2 positions shown; findings below may reference images not displayed]

FINDINGS: The heart size and mediastinal contours are within normal limits.
Both lungs are clear. Disc degenerative disease of the thoracic
spine.
IMPRESSION: No acute abnormality of the lungs.

## 2023-03-25 ENCOUNTER — Emergency Department (HOSPITAL_COMMUNITY)
Admission: EM | Admit: 2023-03-25 | Discharge: 2023-03-25 | Disposition: A | Discharge status: Home / Self Care | Payer: 59 | Source: Home / Self Care | Attending: Emergency Medicine | Admitting: Emergency Medicine

## 2023-03-25 ENCOUNTER — Emergency Department (HOSPITAL_COMMUNITY): Payer: 59

## 2023-03-25 ENCOUNTER — Other Ambulatory Visit: Payer: Self-pay

## 2023-03-25 ENCOUNTER — Encounter (HOSPITAL_COMMUNITY): Payer: Self-pay

## 2023-03-25 DIAGNOSIS — R001 Bradycardia, unspecified: Secondary | ICD-10-CM | POA: Insufficient documentation

## 2023-03-25 DIAGNOSIS — R55 Syncope and collapse: Secondary | ICD-10-CM | POA: Diagnosis present

## 2023-03-25 LAB — COMPREHENSIVE METABOLIC PANEL
ALT: 18 U/L (ref 0–44)
AST: 22 U/L (ref 15–41)
Albumin: 3.7 g/dL (ref 3.5–5.0)
Alkaline Phosphatase: 52 U/L (ref 38–126)
Anion gap: 11 (ref 5–15)
BUN: 13 mg/dL (ref 8–23)
CO2: 19 mmol/L — ABNORMAL LOW (ref 22–32)
Calcium: 8.3 mg/dL — ABNORMAL LOW (ref 8.9–10.3)
Chloride: 108 mmol/L (ref 98–111)
Creatinine, Ser: 1.13 mg/dL (ref 0.61–1.24)
GFR, Estimated: >60 mL/min (ref >60)
Glucose, Bld: 100 mg/dL — ABNORMAL HIGH (ref 70–99)
Potassium: 4.4 mmol/L (ref 3.5–5.1)
Sodium: 138 mmol/L (ref 135–145)
Total Bilirubin: 0.5 mg/dL (ref 0.3–1.2)
Total Protein: 7.1 g/dL (ref 6.5–8.1)

## 2023-03-25 LAB — CBC
HCT: 48.2 % (ref 39.0–52.0)
Hemoglobin: 14 g/dL (ref 13.0–17.0)
MCH: 20.8 pg — ABNORMAL LOW (ref 26.0–34.0)
MCHC: 29 g/dL — ABNORMAL LOW (ref 30.0–36.0)
MCV: 71.5 fL — ABNORMAL LOW (ref 80.0–100.0)
Platelets: 138 10*3/uL — ABNORMAL LOW (ref 150–400)
RBC: 6.74 MIL/uL — ABNORMAL HIGH (ref 4.22–5.81)
RDW: 18.4 % — ABNORMAL HIGH (ref 11.5–15.5)
WBC: 8.6 10*3/uL (ref 4.0–10.5)
nRBC: 0 % (ref 0.0–0.2)

## 2023-03-25 LAB — CBG MONITORING, ED: Glucose-Capillary: 100 mg/dL — ABNORMAL HIGH (ref 70–99)

## 2023-03-25 LAB — DIFFERENTIAL
Abs Immature Granulocytes: 0.03 10*3/uL (ref 0.00–0.07)
Basophils Absolute: 0 10*3/uL (ref 0.0–0.1)
Basophils Relative: 0 %
Eosinophils Absolute: 0 10*3/uL (ref 0.0–0.5)
Eosinophils Relative: 0 %
Immature Granulocytes: 0 %
Lymphocytes Relative: 6 %
Lymphs Abs: 0.5 10*3/uL — ABNORMAL LOW (ref 0.7–4.0)
Monocytes Absolute: 0.2 10*3/uL (ref 0.1–1.0)
Monocytes Relative: 3 %
Neutro Abs: 7.7 10*3/uL (ref 1.7–7.7)
Neutrophils Relative %: 91 %

## 2023-03-25 LAB — TROPONIN I (HIGH SENSITIVITY)
Troponin I (High Sensitivity): 4 ng/L (ref <18)
Troponin I (High Sensitivity): 6 ng/L (ref <18)

## 2023-03-25 LAB — I-STAT CHEM 8, ED
BUN: 15 mg/dL (ref 8–23)
Calcium, Ion: 1.03 mmol/L — ABNORMAL LOW (ref 1.15–1.40)
Chloride: 109 mmol/L (ref 98–111)
Creatinine, Ser: 1.1 mg/dL (ref 0.61–1.24)
Glucose, Bld: 100 mg/dL — ABNORMAL HIGH (ref 70–99)
HCT: 47 % (ref 39.0–52.0)
Hemoglobin: 16 g/dL (ref 13.0–17.0)
Potassium: 4.4 mmol/L (ref 3.5–5.1)
Sodium: 141 mmol/L (ref 135–145)
TCO2: 24 mmol/L (ref 22–32)

## 2023-03-25 LAB — ETHANOL: Alcohol, Ethyl (B): <10 mg/dL (ref <10)

## 2023-03-25 LAB — URINALYSIS, ROUTINE W REFLEX MICROSCOPIC
Bilirubin Urine: NEGATIVE
Glucose, UA: NEGATIVE mg/dL
Hgb urine dipstick: NEGATIVE
Ketones, ur: NEGATIVE mg/dL
Leukocytes,Ua: NEGATIVE
Nitrite: NEGATIVE
Protein, ur: NEGATIVE mg/dL
Specific Gravity, Urine: 1.02 (ref 1.005–1.030)
pH: 5 (ref 5.0–8.0)

## 2023-03-25 LAB — RAPID URINE DRUG SCREEN, HOSP PERFORMED
Amphetamines: NOT DETECTED
Barbiturates: NOT DETECTED
Benzodiazepines: NOT DETECTED
Cocaine: NOT DETECTED
Opiates: NOT DETECTED
Tetrahydrocannabinol: POSITIVE — AB

## 2023-03-25 LAB — APTT: aPTT: 24 s (ref 24–36)

## 2023-03-25 LAB — PROTIME-INR
INR: 1.1 (ref 0.8–1.2)
Prothrombin Time: 14.8 s (ref 11.4–15.2)

## 2023-03-25 MED ORDER — SODIUM CHLORIDE 0.9% FLUSH: 3.0000 mL | Freq: Once | INTRAVENOUS | Status: DC

## 2023-03-25 MED ORDER — SODIUM CHLORIDE 0.9 % IV BOLUS
1000.0000 mL | Freq: Once | INTRAVENOUS | Status: AC
  Administered 2023-03-25: 1000 mL via INTRAVENOUS

## 2023-03-25 NOTE — ED Provider Notes (Signed)
Care of patient received from prior provider at 3:55 PM, please see their note for complete H/P and care plan.  Received handoff per ED course.  Clinical Course as of 03/25/23 1931  Mon Mar 25, 2023  1550 Stable Near syncope earlier today.  Bradycardia. Being hydrated. Reassess.  [CC]  1923 Still waiting on ambulation trial.  Asked charge nurse to send assistance to assignment to ambulate patient.  Patient denies any active symptoms at this time requesting discharge. [CC]    Clinical Course User Index [CC] Glyn Ade, MD    Reassessment: Symptoms all resolved. Now ambulatory and tolerating PO intake in no acute distress.  Disposition:  I have considered need for hospitalization, however, considering all of the above, I believe this patient is stable for discharge at this time.  Patient/family educated about specific return precautions for given chief complaint and symptoms.  Patient/family educated about follow-up with PCP.     Patient/family expressed understanding of return precautions and need for follow-up. Patient spoken to regarding all imaging and laboratory results and appropriate follow up for these results. All education provided in verbal form with additional information in written form. Time was allowed for answering of patient questions. Patient discharged.    Emergency Department Medication Summary:   Medications  sodium chloride flush (NS) 0.9 % injection 3 mL (3 mLs Intravenous Not Given 03/25/23 1330)  sodium chloride 0.9 % bolus 1,000 mL (0 mLs Intravenous Stopped 03/25/23 1708)             Glyn Ade, MD 03/25/23 1934

## 2023-03-25 NOTE — ED Notes (Signed)
Pt ambulated in hallway with steady gait. No dizziness reported. MD made aware.

## 2023-03-25 NOTE — ED Notes (Signed)
Attempted to obtain IV, unable to at this time.

## 2023-03-25 NOTE — ED Provider Notes (Signed)
[place] EMERGENCY DEPARTMENT AT [facility] Provider Note  CSN: [id number] Arrival date & time: 03/25/23 1231  Chief Complaint(s) Dizziness and Emesis  HPI [name] is a 62 y.o. male with past medical history as below, significant for chronic back pain, erythrocytosis, GERD, who presents to the ED with complaint of near syncope.  Patient reports around 10 AM this morning he was out fishing, he felt lightheaded like he was going to pass out, nauseated, diaphoretic.  He sat down and symptoms improved.  Symptoms have gradually improved since the onset.  When he stands up it does provoke his symptoms.  No severe chest pain or palpitations.  No dizziness but is feeling lightheaded.  No abdominal pain, no longer nauseated.  No change in bowel or bladder function, no illicit drug use.  No recent undermedication changes.  Was feeling normal prior to onset of symptoms around 10 AM this morning   Past Medical History Past Medical History:  Diagnosis Date   Chronic back pain    Elevated ferritin level 09/09/2012   Erythrocytosis 09/09/2012   GERD (gastroesophageal reflux disease)    Lumbar disc disease    Patient Active Problem List   Diagnosis Date Noted   Erythrocytosis 09/09/2012   Elevated ferritin level 09/09/2012   Chest pain 08/15/2011   Abnormal ECG 08/15/2011   Blood pressure elevated without history of HTN 08/15/2011   Home Medication(s) Prior to Admission medications   Medication Sig Start Date End Date Taking? Authorizing Provider  cyclobenzaprine (FLEXERIL) 10 MG tablet Take 1 tablet (10 mg total) by mouth 2 (two) times daily as needed for muscle spasms. 12/12/21   Linwood Dibbles, MD  ibuprofen (ADVIL,MOTRIN) 800 MG tablet Take 800 mg by mouth every 8 (eight) hours as needed.      [provider]  Na Sulfate-K Sulfate-Mg Sulf (SUPREP BOWEL PREP KIT) 17.5-3.13-1.6 GM/177ML SOLN Take 1 kit by mouth as directed. 06/30/18   Gelene Mink, NP                                                                                                                                     Past Surgical History Past Surgical History:  Procedure Laterality Date   COLONOSCOPY N/A 07/04/2018   Procedure: COLONOSCOPY;  Surgeon: Corbin Ade, MD;  Location: [facility];  Service: Endoscopy;  Laterality: N/A;  9:30   LIPOMA EXCISION     POLYPECTOMY  07/04/2018   Procedure: POLYPECTOMY;  Surgeon: Corbin Ade, MD;  Location: [facility];  Service: Endoscopy;;   Family History Family History  Problem Relation Age of Onset   Coronary artery disease Mother    Diabetes type II Sister    Cancer Brother     Social History Social History   Tobacco Use   Smoking status: Never   Smokeless tobacco: Never  Vaping Use   Vaping status: Never Used  Substance Use Topics   Alcohol use: Yes    Comment: seldom   Drug use: No   Allergies Patient has no known allergies.  Review of Systems Review of Systems  Constitutional:  Positive for diaphoresis. Negative for fever.  Respiratory:  Negative for chest tightness and shortness of breath.   Cardiovascular:  Negative for chest pain and palpitations.  Gastrointestinal:  Positive for nausea. Negative for abdominal pain and vomiting.  Genitourinary:  Negative for dysuria and urgency.  Musculoskeletal:  Negative for neck stiffness.  Skin:  Negative for pallor and wound.  Neurological:  Positive for light-headedness. Negative for syncope and numbness.    Physical Exam Vital Signs  I have reviewed the triage vital signs BP 136/82   Pulse (!) 49   Temp 97.6 F (36.4 C) (Oral)   Resp 11   Ht 6\' 1"  (1.854 m)   Wt 111.1 kg   SpO2 98%   BMI 32.32 kg/m  Physical Exam Vitals and nursing note reviewed.  Constitutional:      General: He is not in acute distress.    Appearance: Normal appearance. He is well-developed.  HENT:     Head: Normocephalic and atraumatic.     Right Ear: External ear normal.     Left Ear:  External ear normal.     Mouth/Throat:     Mouth: Mucous membranes are dry.  Eyes:     General: No scleral icterus. Cardiovascular:     Rate and Rhythm: Regular rhythm. Bradycardia present.     Pulses: Normal pulses.     Heart sounds: Normal heart sounds.  Pulmonary:     Effort: Pulmonary effort is normal. No respiratory distress.     Breath sounds: Normal breath sounds.  Abdominal:     General: Abdomen is flat.     Palpations: Abdomen is soft.     Tenderness: There is no abdominal tenderness.  Musculoskeletal:     Cervical back: No rigidity.     Right lower leg: No edema.     Left lower leg: No edema.  Skin:    General: Skin is warm and dry.     Capillary Refill: Capillary refill takes less than 2 seconds.  Neurological:     Mental Status: He is alert and oriented to person, place, and time.     GCS: GCS eye subscore is 4. GCS verbal subscore is 5. GCS motor subscore is 6.  Psychiatric:        Mood and Affect: Mood normal.        Behavior: Behavior normal.     ED Results and Treatments Labs (all labs ordered are listed, but only abnormal results are displayed) Labs Reviewed  CBC - Abnormal; Notable for the following components:      Result Value   RBC 6.74 (*)    MCV 71.5 (*)    MCH 20.8 (*)    MCHC 29.0 (*)    RDW 18.4 (*)    Platelets 138 (*)    All other components within normal limits  DIFFERENTIAL - Abnormal; Notable for the following components:   Lymphs Abs 0.5 (*)    All other components within normal limits  COMPREHENSIVE METABOLIC PANEL - Abnormal; Notable for the following components:   CO2 19 (*)    Glucose, Bld 100 (*)    Calcium 8.3 (*)    All other components within normal limits  RAPID URINE DRUG SCREEN, HOSP PERFORMED - Abnormal; Notable for the following components:  Tetrahydrocannabinol POSITIVE (*)    All other components within normal limits  I-STAT CHEM 8, ED - Abnormal; Notable for the following components:   Glucose, Bld 100 (*)     Calcium, Ion 1.03 (*)    All other components within normal limits  CBG MONITORING, ED - Abnormal; Notable for the following components:   Glucose-Capillary 100 (*)    All other components within normal limits  PROTIME-INR  APTT  URINALYSIS, ROUTINE W REFLEX MICROSCOPIC  ETHANOL  TROPONIN I (HIGH SENSITIVITY)  TROPONIN I (HIGH SENSITIVITY)                                                                                                                          Radiology DG Chest Portable 1 View  Result Date: 03/25/2023 CLINICAL DATA:  Dizziness EXAM: PORTABLE CHEST 1 VIEW COMPARISON:  12/12/2021 FINDINGS: The heart size and mediastinal contours are within normal limits. Both lungs are clear. The visualized skeletal structures are unremarkable. IMPRESSION: No active disease. Electronically Signed   By: Duanne Guess D.O.   On: 03/25/2023 15:38   CT HEAD WO CONTRAST  Result Date: 03/25/2023 CLINICAL DATA:  Headache, neuro deficit EXAM: CT HEAD WITHOUT CONTRAST TECHNIQUE: Contiguous axial images were obtained from the base of the skull through the vertex without intravenous contrast. RADIATION DOSE REDUCTION: This exam was performed according to the departmental dose-optimization program which includes automated exposure control, adjustment of the mA and/or kV according to patient size and/or use of iterative reconstruction technique. COMPARISON:  06/21/2014 FINDINGS: Brain: No evidence of acute infarction, hemorrhage, hydrocephalus, extra-axial collection or mass lesion/mass effect. Vascular: No hyperdense vessel or unexpected calcification. Skull: Normal. Negative for fracture or focal lesion. Sinuses/Orbits: No acute finding. Other: None. IMPRESSION: No acute intracranial findings. Electronically Signed   By: Duanne Guess D.O.   On: 03/25/2023 15:03    Pertinent labs & imaging results that were available during my care of the patient were reviewed by me and considered in my medical decision  making (see MDM for details).  Medications Ordered in ED Medications  sodium chloride flush (NS) 0.9 % injection 3 mL (3 mLs Intravenous Not Given 03/25/23 1330)  sodium chloride 0.9 % bolus 1,000 mL (1,000 mLs Intravenous New Bag/Given 03/25/23 1516)  Procedures Procedures  (including critical care time)  Medical Decision Making / ED Course    Medical Decision Making:    [name] is a 62 y.o. male with past medical history as below, significant for chronic back pain, erythrocytosis, GERD, who presents to the ED with complaint of near syncope.. The complaint involves an extensive differential diagnosis and also carries with it a high risk of complications and morbidity.  Serious etiology was considered. Ddx includes but is not limited to: Dehydration, arrhythmia, metabolic derangement, infectious, symptomatic bradycardia, toxidrome, etc.  Complete initial physical exam performed, notably the patient  was bradycardia noted, otherwise no acute distress, exam stable.    Reviewed and confirmed nursing documentation for past medical history, family history, social history.  Vital signs reviewed.    Clinical Course as of 03/25/23 1609  Mon Mar 25, 2023  1550 Stable Near syncope earlier today.  Bradycardia. Being hydrated. Reassess.  [CC]    Clinical Course User Index [CC] Glyn Ade, MD     -Patient with near syncope, lightheaded, diaphoretic, nauseated earlier today.  Symptoms have since improved. -Give IV fluids.  Check screening labs. -He has a positive THC, this may be contributory factor in his symptoms. -Labs initially stable.  Chest x-ray stable. -Signed out to incoming EDP pending remainder of labs and fluids, ambulation. -Can likely be discharged home if symptoms improved                Additional history  obtained: -Additional history obtained from spouse -External records from outside source obtained and reviewed including: Chart review including previous notes, labs, imaging, consultation notes including  Primary care documentation, home medications, prior labs imaging   Lab Tests: -I ordered, reviewed, and interpreted labs.   The pertinent results include:   Labs Reviewed  CBC - Abnormal; Notable for the following components:      Result Value   RBC 6.74 (*)    MCV 71.5 (*)    MCH 20.8 (*)    MCHC 29.0 (*)    RDW 18.4 (*)    Platelets 138 (*)    All other components within normal limits  DIFFERENTIAL - Abnormal; Notable for the following components:   Lymphs Abs 0.5 (*)    All other components within normal limits  COMPREHENSIVE METABOLIC PANEL - Abnormal; Notable for the following components:   CO2 19 (*)    Glucose, Bld 100 (*)    Calcium 8.3 (*)    All other components within normal limits  RAPID URINE DRUG SCREEN, HOSP PERFORMED - Abnormal; Notable for the following components:   Tetrahydrocannabinol POSITIVE (*)    All other components within normal limits  I-STAT CHEM 8, ED - Abnormal; Notable for the following components:   Glucose, Bld 100 (*)    Calcium, Ion 1.03 (*)    All other components within normal limits  CBG MONITORING, ED - Abnormal; Notable for the following components:   Glucose-Capillary 100 (*)    All other components within normal limits  PROTIME-INR  APTT  URINALYSIS, ROUTINE W REFLEX MICROSCOPIC  ETHANOL  TROPONIN I (HIGH SENSITIVITY)  TROPONIN I (HIGH SENSITIVITY)    Notable for [facility]  EKG   EKG Interpretation Date/Time:  Monday March 25 2023 13:34:58 EDT Ventricular Rate:  49 PR Interval:  214 QRS Duration:  113 QT Interval:  431 QTC Calculation: 389 R Axis:   80  Text Interpretation: Sinus bradycardia Borderline intraventricular conduction delay Confirmed by Tanda Rockers ([id number]) on 03/25/2023  3:50:11 PM         Imaging Studies  ordered: I ordered imaging studies including x-ray I independently visualized the following imaging with scope of interpretation limited to determining acute life threatening conditions related to emergency care; findings noted above, significant for stable x-ray of chest I independently visualized and interpreted imaging. I agree with the radiologist interpretation   Medicines ordered and prescription drug management: Meds ordered this encounter  Medications   sodium chloride flush (NS) 0.9 % injection 3 mL   sodium chloride 0.9 % bolus 1,000 mL    -I have reviewed the patients home medicines and have made adjustments as needed   Consultations Obtained: na   Cardiac Monitoring: The patient was maintained on a cardiac monitor.  I personally viewed and interpreted the cardiac monitored which showed an underlying rhythm of: sinus brady  Social Determinants of Health:  Diagnosis or treatment significantly limited by social determinants of health: obesity   Reevaluation: After the interventions noted above, I reevaluated the patient and found that they have improved  Co morbidities that complicate the patient evaluation  Past Medical History:  Diagnosis Date   Chronic back pain    Elevated ferritin level 09/09/2012   Erythrocytosis 09/09/2012   GERD (gastroesophageal reflux disease)    Lumbar disc disease       Dispostion: Disposition decision including need for hospitalization was considered, and patient disposition pending at time of sign out.    Final Clinical Impression(s) / ED Diagnoses Final diagnoses:  Near syncope        Sloan Leiter, DO 03/25/23 1609

## 2023-03-25 NOTE — Discharge Instructions (Addendum)
It was a pleasure caring for you today in the emergency department. ° °Please return to the emergency department for any worsening or worrisome symptoms. ° ° °

## 2023-03-25 NOTE — ED Triage Notes (Addendum)
PT BIBPOV with c/o of sudden onset of dizziness and vomiting. Pt was fishing when this all began with some diaphoresis. Pt found on floor by wife with headache and moving real sluggish. Pt is having a hard time staying awake per the family but other than that acting at baseline.

## 2023-07-09 ENCOUNTER — Encounter (INDEPENDENT_AMBULATORY_CARE_PROVIDER_SITE_OTHER): Payer: Self-pay | Admitting: *Deleted

## 2023-10-15 ENCOUNTER — Other Ambulatory Visit (HOSPITAL_COMMUNITY): Payer: Self-pay | Admitting: Family Medicine

## 2023-10-15 ENCOUNTER — Ambulatory Visit (HOSPITAL_COMMUNITY)
Admission: RE | Admit: 2023-10-15 | Discharge: 2023-10-15 | Disposition: A | Discharge status: Home / Self Care | Source: Ambulatory Visit | Attending: Family Medicine | Admitting: Family Medicine

## 2023-10-15 DIAGNOSIS — R519 Headache, unspecified: Secondary | ICD-10-CM | POA: Insufficient documentation

## 2023-10-16 ENCOUNTER — Telehealth: Payer: Self-pay | Admitting: *Deleted

## 2023-10-16 NOTE — Telephone Encounter (Signed)
  Procedure: Colonoscopy   Height: 6'1" Weight: 245 lb      Have you had a colonoscopy before?  Yes, 06/2018,Dr.Rourk  Do you have family history of colon cancer?  no  Do you have a family history of polyps? no  Previous colonoscopy with polyps removed? yes  Do you have a history colorectal cancer?   no  Are you diabetic?  no  Do you have a prosthetic or mechanical heart valve? no  Do you have a pacemaker/defibrillator?   no  Have you had endocarditis/atrial fibrillation?  no  Do you use supplemental oxygen/CPAP?  no  Have you had joint replacement within the last 12 months?  no  Do you tend to be constipated or have to use laxatives?  no   Do you have history of alcohol use? If yes, how much and how often.  no  Do you have history or are you using drugs? If yes, what do are you  using?  no  Have you ever had a stroke/heart attack?  no  Have you ever had a heart or other vascular stent placed,?no  Do you take weight loss medication? no   Do you take any blood-thinning medications such as: (Plavix, aspirin, Coumadin, Aggrenox, Brilinta, Xarelto, Eliquis, Pradaxa, Savaysa or Effient)? no  If yes we need the name, milligram, dosage and who is prescribing doctor:  n/a             Current Outpatient Medications  Medication Sig Dispense Refill   cyclobenzaprine (FLEXERIL) 10 MG tablet Take 1 tablet (10 mg total) by mouth 2 (two) times daily as needed for muscle spasms. (Patient not taking: Reported on 10/16/2023) 14 tablet 0   ibuprofen (ADVIL,MOTRIN) 800 MG tablet Take 800 mg by mouth every 8 (eight) hours as needed.       Na Sulfate-K Sulfate-Mg Sulf (SUPREP BOWEL PREP KIT) 17.5-3.13-1.6 GM/177ML SOLN Take 1 kit by mouth as directed. (Patient not taking: Reported on 10/16/2023) 1 Bottle 0   No current facility-administered medications for this visit.    No Known Allergies

## 2023-11-22 ENCOUNTER — Other Ambulatory Visit: Payer: Self-pay | Admitting: *Deleted

## 2023-11-22 ENCOUNTER — Encounter: Payer: Self-pay | Admitting: *Deleted

## 2023-11-22 MED ORDER — PEG 3350-KCL-NA BICARB-NACL 420 G PO SOLR: 4000.0000 mL | Freq: Once | ORAL | 0 refills | Status: DC

## 2023-11-22 NOTE — Addendum Note (Signed)
 Addended by: Lanney Pitts on: 11/22/2023 10:09 AM   Modules accepted: Orders

## 2023-11-22 NOTE — Telephone Encounter (Signed)
Ok to schedule. ASA 2.  

## 2023-11-22 NOTE — Telephone Encounter (Signed)
 Pt has been scheduled for 12/20/23 with Dr.Rourk, instructions mailed and prep sent to the pharmacy

## 2023-12-17 ENCOUNTER — Other Ambulatory Visit: Payer: Self-pay | Admitting: *Deleted

## 2023-12-17 MED ORDER — PEG 3350-KCL-NA BICARB-NACL 420 G PO SOLR: 4000.0000 mL | Freq: Once | ORAL | 0 refills | Status: AC

## 2023-12-20 ENCOUNTER — Ambulatory Visit (HOSPITAL_COMMUNITY)
Admission: RE | Admit: 2023-12-20 | Discharge: 2023-12-20 | Disposition: A | Discharge status: Home / Self Care | Attending: Internal Medicine | Admitting: Internal Medicine

## 2023-12-20 ENCOUNTER — Ambulatory Visit (HOSPITAL_COMMUNITY): Admitting: Certified Registered"

## 2023-12-20 ENCOUNTER — Encounter (HOSPITAL_COMMUNITY): Payer: Self-pay | Admitting: Internal Medicine

## 2023-12-20 ENCOUNTER — Other Ambulatory Visit: Payer: Self-pay

## 2023-12-20 ENCOUNTER — Encounter (HOSPITAL_COMMUNITY)
Admission: RE | Disposition: A | Discharge status: Home / Self Care | Payer: Self-pay | Source: Home / Self Care | Attending: Internal Medicine

## 2023-12-20 DIAGNOSIS — Z860101 Personal history of adenomatous and serrated colon polyps: Secondary | ICD-10-CM | POA: Diagnosis present

## 2023-12-20 DIAGNOSIS — K219 Gastro-esophageal reflux disease without esophagitis: Secondary | ICD-10-CM | POA: Insufficient documentation

## 2023-12-20 DIAGNOSIS — K5730 Diverticulosis of large intestine without perforation or abscess without bleeding: Secondary | ICD-10-CM | POA: Diagnosis not present

## 2023-12-20 DIAGNOSIS — Z1211 Encounter for screening for malignant neoplasm of colon: Secondary | ICD-10-CM

## 2023-12-20 DIAGNOSIS — I10 Essential (primary) hypertension: Secondary | ICD-10-CM | POA: Diagnosis not present

## 2023-12-20 DIAGNOSIS — Z8249 Family history of ischemic heart disease and other diseases of the circulatory system: Secondary | ICD-10-CM | POA: Insufficient documentation

## 2023-12-20 HISTORY — PX: COLONOSCOPY: SHX5424

## 2023-12-20 SURGERY — COLONOSCOPY
Anesthesia: General

## 2023-12-20 MED ORDER — PHENYLEPHRINE 80 MCG/ML (10ML) SYRINGE FOR IV PUSH (FOR BLOOD PRESSURE SUPPORT)
PREFILLED_SYRINGE | INTRAVENOUS | Status: AC
Start: 2023-12-20 — End: ?
  Filled 2023-12-20: qty 10

## 2023-12-20 MED ORDER — PROPOFOL 500 MG/50ML IV EMUL
INTRAVENOUS | Status: DC | PRN
Start: 2023-12-20 — End: 2023-12-20
  Administered 2023-12-20: 250 ug/kg/min via INTRAVENOUS

## 2023-12-20 MED ORDER — PHENYLEPHRINE 80 MCG/ML (10ML) SYRINGE FOR IV PUSH (FOR BLOOD PRESSURE SUPPORT)
PREFILLED_SYRINGE | INTRAVENOUS | Status: DC | PRN
  Administered 2023-12-20: 80 ug via INTRAVENOUS
  Administered 2023-12-20: 160 ug via INTRAVENOUS

## 2023-12-20 MED ORDER — PROPOFOL 10 MG/ML IV BOLUS
INTRAVENOUS | Status: DC | PRN
  Administered 2023-12-20: 100 mg via INTRAVENOUS

## 2023-12-20 MED ORDER — SIMETHICONE 40 MG/0.6ML PO SUSP
ORAL | Status: DC | PRN
  Administered 2023-12-20: 60 mL

## 2023-12-20 MED ORDER — LACTATED RINGERS IV SOLN: INTRAVENOUS | Status: DC

## 2023-12-20 MED ORDER — LACTATED RINGERS IV SOLN: INTRAVENOUS | Status: DC | PRN

## 2023-12-20 MED ORDER — LIDOCAINE 2% (20 MG/ML) 5 ML SYRINGE
INTRAMUSCULAR | Status: DC | PRN
  Administered 2023-12-20: 100 mg via INTRAVENOUS

## 2023-12-20 NOTE — H&P (Signed)
@  JXBJ@   Primary Care Physician:  Minus Amel, MD Primary Gastroenterologist:  Dr. Riley Cheadle  Pre-Procedure History & Physical: HPI:  [name] is a 63 y.o. male here for a surveillance colonoscopy.  Multiple adenomas removed 2019.  No bowel symptoms at this time.  Past Medical History:  Diagnosis Date   Chronic back pain    Elevated ferritin level 09/09/2012   Erythrocytosis 09/09/2012   GERD (gastroesophageal reflux disease)    Lumbar disc disease     Past Surgical History:  Procedure Laterality Date   COLONOSCOPY N/A 07/04/2018   Procedure: COLONOSCOPY;  Surgeon: Suzette Espy, MD;  Location: [facility];  Service: Endoscopy;  Laterality: N/A;  9:30   LIPOMA EXCISION     POLYPECTOMY  07/04/2018   Procedure: POLYPECTOMY;  Surgeon: Suzette Espy, MD;  Location: [facility];  Service: Endoscopy;;    Prior to Admission medications   Medication Sig Start Date End Date Taking? Authorizing Provider  ibuprofen  (ADVIL ,MOTRIN ) 800 MG tablet Take 800 mg by mouth every 8 (eight) hours as needed.     Yes [provider]    Allergies as of 11/22/2023   (No Known Allergies)    Family History  Problem Relation Age of Onset   Coronary artery disease Mother    Diabetes type II Sister    Cancer Brother     Social History   Socioeconomic History   Marital status: Married    Spouse name: Not on file   Number of children: Not on file   Years of education: Not on file   Highest education level: Not on file  Occupational History   Not on file  Tobacco Use   Smoking status: Never   Smokeless tobacco: Never  Vaping Use   Vaping status: Never Used  Substance and Sexual Activity   Alcohol use: Yes    Comment: seldom   Drug use: No   Sexual activity: Not on file  Other Topics Concern   Not on file  Social History Narrative   Not on file   Social Drivers of Health   Financial Resource Strain: Not on file  Food Insecurity: Not on file  Transportation  Needs: Not on file  Physical Activity: Not on file  Stress: Not on file  Social Connections: Not on file  Intimate Partner Violence: Not on file    Review of Systems: See HPI, otherwise negative ROS  Physical Exam: BP 131/84   Pulse 66   Temp 98.6 F (37 C) (Oral)   Resp 14   Ht 6\' 1"  (1.854 m)   Wt 113.9 kg   SpO2 98%   BMI 33.12 kg/m  General:   Alert,  Well-developed, well-nourished, pleasant and cooperative in NAD Neck:  Supple; no masses or thyromegaly. No significant cervical adenopathy. Lungs:  Clear throughout to auscultation.   No wheezes, crackles, or rhonchi. No acute distress. Heart:  Regular rate and rhythm; no murmurs, clicks, rubs,  or gallops. Abdomen: Non-distended, normal bowel sounds.  Soft and nontender without appreciable mass or hepatosplenomegaly.   Impression/Plan: 63 year old gentleman history colonic adenomas here for surveillance colonoscopy. The risks, benefits, limitations, alternatives and imponderables have been reviewed with the patient. Questions have been answered. All parties are agreeable.       Notice: This dictation was prepared with Dragon dictation along with smaller phrase technology. Any transcriptional errors that result from this process are unintentional and may not be corrected upon review.

## 2023-12-20 NOTE — Transfer of Care (Signed)
 Immediate Anesthesia Transfer of Care Note  Patient: [name]  Procedure(s) Performed: COLONOSCOPY  Patient Location: Endoscopy Unit  Anesthesia Type:General  Level of Consciousness: drowsy  Airway & Oxygen Therapy: Patient Spontanous Breathing  Post-op Assessment: Report given to RN and Post -op Vital signs reviewed and stable  Post vital signs: Reviewed and stable  Last Vitals:  Vitals Value Taken Time  BP    Temp    Pulse    Resp    SpO2      Last Pain:  Vitals:   12/20/23 0734  TempSrc:   PainSc: 0-No pain      Patients Stated Pain Goal: 10 (12/20/23 4098)  Complications: No notable events documented.

## 2023-12-20 NOTE — Discharge Instructions (Addendum)
  Colonoscopy Discharge Instructions  Read the instructions outlined below and refer to this sheet in the next few weeks. These discharge instructions provide you with general information on caring for yourself after you leave the hospital. Your doctor may also give you specific instructions. While your treatment has been planned according to the most current medical practices available, unavoidable complications occasionally occur. If you have any problems or questions after discharge, call Dr. Riley Cheadle at [phone]. ACTIVITY You may resume your regular activity, but move at a slower pace for the next 24 hours.  Take frequent rest periods for the next 24 hours.  Walking will help get rid of the air and reduce the bloated feeling in your belly (abdomen).  No driving for 24 hours (because of the medicine (anesthesia) used during the test).   Do not sign any important legal documents or operate any machinery for 24 hours (because of the anesthesia used during the test).  NUTRITION Drink plenty of fluids.  You may resume your normal diet as instructed by your doctor.  Begin with a light meal and progress to your normal diet. Heavy or fried foods are harder to digest and may make you feel sick to your stomach (nauseated).  Avoid alcoholic beverages for 24 hours or as instructed.  MEDICATIONS You may resume your normal medications unless your doctor tells you otherwise.  WHAT YOU CAN EXPECT TODAY Some feelings of bloating in the abdomen.  Passage of more gas than usual.  Spotting of blood in your stool or on the toilet paper.  IF YOU HAD POLYPS REMOVED DURING THE COLONOSCOPY: No aspirin  products for 7 days or as instructed.  No alcohol for 7 days or as instructed.  Eat a soft diet for the next 24 hours.  FINDING OUT THE RESULTS OF YOUR TEST Not all test results are available during your visit. If your test results are not back during the visit, make an appointment with your caregiver to find out the  results. Do not assume everything is normal if you have not heard from your caregiver or the medical facility. It is important for you to follow up on all of your test results.  SEEK IMMEDIATE MEDICAL ATTENTION IF: You have more than a spotting of blood in your stool.  Your belly is swollen (abdominal distention).  You are nauseated or vomiting.  You have a temperature over 101.  You have abdominal pain or discomfort that is severe or gets worse throughout the day.     No polyps found today  Diverticulosis information provided  It is recommended you return for repeat colonoscopy in 7 years

## 2023-12-20 NOTE — Op Note (Signed)
 [facility] Patient Name: [name] Procedure Date: 12/20/2023 7:11 AM MRN: [id number] Date of Birth: [birth year] Attending MD: Gemma Kelp , MD, [id number] CSN: [id number] Age: 63 Admit Type: Outpatient Procedure:                Colonoscopy Indications:              High risk colon cancer surveillance: Personal                            history of colonic polyps Providers:                Gemma Kelp, MD, Karyle Pagoda, RN, Alisa App, Annell Barrow Referring MD:             Gemma Kelp, MD Medicines:                Propofol per Anesthesia Complications:            No immediate complications. Estimated Blood Loss:     Estimated blood loss: none. Procedure:                Pre-Anesthesia Assessment:                           - Prior to the procedure, a History and Physical                            was performed, and patient medications and                            allergies were reviewed. The patient's tolerance of                            previous anesthesia was also reviewed. The risks                            and benefits of the procedure and the sedation                            options and risks were discussed with the patient.                            All questions were answered, and informed consent                            was obtained. Prior Anticoagulants: The patient has                            taken no anticoagulant or antiplatelet agents. ASA                            Grade Assessment: II - A patient with mild systemic  disease. After reviewing the risks and benefits,                            the patient was deemed in satisfactory condition to                            undergo the procedure.                           After obtaining informed consent, the colonoscope                            was passed under direct vision. Throughout the                             procedure, the patient's blood pressure, pulse, and                            oxygen saturations were monitored continuously. The                            [id number] scope was introduced through                            the anus and advanced to the the cecum, identified                            by appendiceal orifice and ileocecal valve. The                            colonoscopy was performed without difficulty. The                            patient tolerated the procedure well. The quality                            of the bowel preparation was adequate. The                            ileocecal valve, appendiceal orifice, and rectum                            were photographed. The entire colon was well                            visualized. Scope In: 7:37:39 AM Scope Out: 7:49:26 AM Scope Withdrawal Time: 0 hours 7 minutes 51 seconds  Total Procedure Duration: 0 hours 11 minutes 47 seconds  Findings:      The perianal and digital rectal examinations were normal.      Scattered medium-mouthed diverticula were found in the sigmoid colon and       descending colon.      The exam was otherwise without abnormality on direct and retroflexion       views. Impression:               -  Diverticulosis in the sigmoid colon and in the                            descending colon.                           - The examination was otherwise normal on direct                            and retroflexion views.                           - No specimens collected. Moderate Sedation:      Moderate (conscious) sedation was personally administered by an       anesthesia professional. The following parameters were monitored: oxygen       saturation, heart rate, blood pressure, respiratory rate, EKG, adequacy       of pulmonary ventilation, and response to care.      Moderate (conscious) sedation was personally administered by an       anesthesia professional. The following parameters were  monitored: oxygen       saturation, heart rate, blood pressure, respiratory rate, EKG, adequacy       of pulmonary ventilation, and response to care. Recommendation:           - Patient has a contact number available for                            emergencies. The signs and symptoms of potential                            delayed complications were discussed with the                            patient. Return to normal activities tomorrow.                            Written discharge instructions were provided to the                            patient.                           - Advance diet as tolerated.                           - Continue present medications.                           - Repeat colonoscopy in 7 years for surveillance.                           - Return to GI office (date not yet determined). Procedure Code(s):        --- Professional ---                           [phone], Colonoscopy, flexible; diagnostic, including  collection of specimen(s) by brushing or washing,                            when performed (separate procedure) Diagnosis Code(s):        --- Professional ---                           Z86.010, Personal history of colonic polyps                           K57.30, Diverticulosis of large intestine without                            perforation or abscess without bleeding CPT copyright 2022 [employer]. All rights reserved. The codes documented in this report are preliminary and upon coder review may  be revised to meet current compliance requirements. Windsor Hatcher. Ferry Matthis, MD Gemma Kelp, MD 12/20/2023 7:55:41 AM This report has been signed electronically. Number of Addenda: 0

## 2023-12-20 NOTE — Anesthesia Procedure Notes (Signed)
 Date/Time: 12/20/2023 7:32 AM  Performed by: Sherwin Donate, CRNAPre-anesthesia Checklist: Patient identified, Emergency Drugs available, Suction available and Patient being monitored Patient Re-evaluated:Patient Re-evaluated prior to induction Oxygen Delivery Method: Nasal cannula Induction Type: IV induction Placement Confirmation: positive ETCO2

## 2023-12-20 NOTE — Anesthesia Postprocedure Evaluation (Signed)
 Anesthesia Post Note  Patient: [name]  Procedure(s) Performed: COLONOSCOPY  Patient location during evaluation: Phase II Anesthesia Type: General Level of consciousness: awake Pain management: pain level controlled Vital Signs Assessment: post-procedure vital signs reviewed and stable Respiratory status: spontaneous breathing and respiratory function stable Cardiovascular status: blood pressure returned to baseline and stable Postop Assessment: no headache and no apparent nausea or vomiting Anesthetic complications: no Comments: Late entry   No notable events documented.   Last Vitals:  Vitals:   12/20/23 0644 12/20/23 0752  BP: 131/84 115/74  Pulse: 66 77  Resp: 14 18  Temp: 37 C 36.6 C  SpO2: 98% 96%    Last Pain:  Vitals:   12/20/23 0752  TempSrc: Oral  PainSc:                  Coretha Dew

## 2023-12-20 NOTE — Anesthesia Preprocedure Evaluation (Signed)
 Anesthesia Evaluation  Patient identified by MRN, date of birth, ID band Patient awake    Reviewed: Allergy & Precautions, H&P , NPO status , Patient's Chart, lab work & pertinent test results, reviewed documented beta blocker date and time   Airway Mallampati: II  TM Distance: >3 FB Neck ROM: full    Dental no notable dental hx.    Pulmonary neg pulmonary ROS   Pulmonary exam normal breath sounds clear to auscultation       Cardiovascular Exercise Tolerance: Good hypertension, negative cardio ROS  Rhythm:regular Rate:Normal     Neuro/Psych negative neurological ROS  negative psych ROS   GI/Hepatic Neg liver ROS,GERD  ,,  Endo/Other  negative endocrine ROS    Renal/GU negative Renal ROS  negative genitourinary   Musculoskeletal   Abdominal   Peds  Hematology negative hematology ROS (+)   Anesthesia Other Findings   Reproductive/Obstetrics negative OB ROS                             Anesthesia Physical Anesthesia Plan  ASA: 2  Anesthesia Plan: General   Post-op Pain Management:    Induction:   PONV Risk Score and Plan: Propofol infusion  Airway Management Planned:   Additional Equipment:   Intra-op Plan:   Post-operative Plan:   Informed Consent: I have reviewed the patients History and Physical, chart, labs and discussed the procedure including the risks, benefits and alternatives for the proposed anesthesia with the patient or authorized representative who has indicated his/her understanding and acceptance.     Dental Advisory Given  Plan Discussed with: CRNA  Anesthesia Plan Comments:        Anesthesia Quick Evaluation

## 2023-12-24 ENCOUNTER — Encounter (HOSPITAL_COMMUNITY): Payer: Self-pay | Admitting: Internal Medicine

## 2024-05-13 ENCOUNTER — Emergency Department (HOSPITAL_COMMUNITY)

## 2024-05-13 ENCOUNTER — Emergency Department (HOSPITAL_COMMUNITY): Admission: EM | Admit: 2024-05-13 | Discharge: 2024-05-13 | Disposition: A | Discharge status: Home / Self Care

## 2024-05-13 ENCOUNTER — Other Ambulatory Visit: Payer: Self-pay

## 2024-05-13 ENCOUNTER — Encounter (HOSPITAL_COMMUNITY): Payer: Self-pay

## 2024-05-13 DIAGNOSIS — M1712 Unilateral primary osteoarthritis, left knee: Secondary | ICD-10-CM | POA: Diagnosis not present

## 2024-05-13 DIAGNOSIS — M25562 Pain in left knee: Secondary | ICD-10-CM | POA: Diagnosis present

## 2024-05-13 MED ORDER — NAPROXEN 500 MG PO TABS: 500.0000 mg | ORAL_TABLET | Freq: Two times a day (BID) | ORAL | 0 refills | Status: AC

## 2024-05-13 MED ORDER — NAPROXEN 500 MG PO TABS: 500.0000 mg | ORAL_TABLET | Freq: Two times a day (BID) | ORAL | 0 refills | Status: DC

## 2024-05-13 MED ORDER — OXYCODONE-ACETAMINOPHEN 5-325 MG PO TABS: 1.0000 | ORAL_TABLET | Freq: Four times a day (QID) | ORAL | 0 refills | Status: DC | PRN

## 2024-05-13 MED ORDER — OXYCODONE-ACETAMINOPHEN 5-325 MG PO TABS: 1.0000 | ORAL_TABLET | Freq: Four times a day (QID) | ORAL | 0 refills | Status: AC | PRN

## 2024-05-13 MED ORDER — OXYCODONE-ACETAMINOPHEN 5-325 MG PO TABS
1.0000 | ORAL_TABLET | Freq: Once | ORAL | Status: AC
  Administered 2024-05-13: 1 via ORAL
  Filled 2024-05-13: qty 1

## 2024-05-13 MED ORDER — KETOROLAC TROMETHAMINE 15 MG/ML IJ SOLN
15.0000 mg | Freq: Once | INTRAMUSCULAR | Status: AC
  Administered 2024-05-13: 15 mg via INTRAMUSCULAR
  Filled 2024-05-13: qty 1

## 2024-05-13 NOTE — Discharge Instructions (Addendum)
 Your x-ray does show some significant arthritis.  Please stop the ibuprofen  and switch to the naproxen to see if this helps better.  Take the Percocet as needed for breakthrough pain if you are still having pain.  Please call and schedule a follow-up appointment with the orthopedic office at the number provided.  Return to the ER for worsening symptoms.

## 2024-05-13 NOTE — ED Notes (Addendum)
 Pt wants to speak to Dr before he is discharged

## 2024-05-13 NOTE — ED Provider Notes (Signed)
 [place] EMERGENCY DEPARTMENT AT [facility] Provider Note   CSN: [id number] Arrival date & time: 05/13/24  9289     Patient presents with: Knee Pain   [name] is a 63 y.o. male.   63 year old male with past medical history of GERD presenting to the emergency department today with left knee pain.  The patient states this been going on now for the past few days.  Reports that he has not really had any injuries.  He states that the knee is little more swollen than normal.  He denies any fevers with this.  Denies any history of IV drug abuse.  He came to the ER today for further evaluation due to these ongoing symptoms.  He has been taking Tylenol 1000 mg at a time as well as 800 mg of ibuprofen  with minimal improvement.  He denies a history of DVT or pulmonary embolism, recent surgeries, recent travel.  Denies any chest pain or shortness of breath.   Knee Pain      Prior to Admission medications   Medication Sig Start Date End Date Taking? Authorizing Provider  naproxen (NAPROSYN) 500 MG tablet Take 1 tablet (500 mg total) by mouth 2 (two) times daily. 05/13/24  Yes Ula Prentice SAUNDERS, MD  oxyCODONE-acetaminophen (PERCOCET/ROXICET) 5-325 MG tablet Take 1 tablet by mouth every 6 (six) hours as needed for severe pain (pain score 7-10). 05/13/24  Yes Ula Prentice SAUNDERS, MD  ibuprofen  (ADVIL ,MOTRIN ) 800 MG tablet Take 800 mg by mouth every 8 (eight) hours as needed.      [provider]    Allergies: Patient has no known allergies.    Review of Systems  Musculoskeletal:  Positive for arthralgias.  All other systems reviewed and are negative.   Updated Vital Signs BP 118/84 (BP Location: Left Arm)   Pulse 72   Temp 98.4 F (36.9 C) (Oral)   Resp 18   SpO2 99%   Physical Exam Vitals and nursing note reviewed.   Gen: NAD Eyes: PERRL, EOMI HEENT: no oropharyngeal swelling Neck: trachea midline Resp: clear to auscultation bilaterally Card: RRR, no  murmurs, rubs, or gallops Abd: nontender, nondistended Extremities: no calf tenderness, no edema MSK: The patient has some mild swelling noted to the left knee with no overlying erythema or warmth, the patient has normal range of motion both actively and passively.  There is some crepitus in the knee joint with passive range of motion.  No tenderness over the popliteal fossa.  Compartments are soft.  The knee joint is stable. Vascular: 2+ radial pulses bilaterally, 2+ DP pulses bilaterally Neuro: Antalgic gait noted, 5 out of 5 strength throughout the bilateral lower extremities Skin: no rashes   (all labs ordered are listed, but only abnormal results are displayed) Labs Reviewed - No data to display  EKG: None  Radiology: DG Knee Complete 4 Views Left Result Date: 05/13/2024 EXAM: 4 OR MORE VIEW(S) XRAY OF THE LEFT KNEE 05/13/2024 07:46:00 AM COMPARISON: Left knee series 08/10/2013. CLINICAL HISTORY: 63 year old male with left knee swelling, sharp pain, and stiffness since Monday afternoon. No injury. FINDINGS: BONES AND JOINTS: No acute osseous abnormality identified. No joint dislocation. Evidence of a suprapatellar joint effusion. Chronic and progressed knee joint degeneration since 2015. Chronically advanced patellofemoral compartment joint space loss, progressed and now moderate medial compartment joint space loss, and tricompartmental degenerative spurring. Dystrophic calcification along the distal medial femoral condyle is new since 2015. SOFT TISSUES: The soft tissues are unremarkable. IMPRESSION:  1. No acute osseous abnormality identified. 2. Progressed chronic left knee osteoarthritis with advanced patellofemoral and moderate medial compartment degeneration. New dystrophic calcification along the distal medial femoral condyle. Small to moderate joint effusion suspected. Electronically signed by: Helayne Hurst MD 05/13/2024 07:53 AM EDT RP Workstation: [id number]     Procedures    Medications Ordered in the ED  ketorolac (TORADOL) 15 MG/ML injection 15 mg (15 mg Intramuscular Given 05/13/24 0745)  oxyCODONE-acetaminophen (PERCOCET/ROXICET) 5-325 MG per tablet 1 tablet (1 tablet Oral Given 05/13/24 0745)                                    Medical Decision Making 63 year old male with past medical history of GERD presenting to the emergency department today with left knee pain.  Given his age we will obtain x-ray to evaluate for underlying bony abnormality.  Exam is not consistent with septic arthritis at this time.  He has no calf tenderness or tenderness over the popliteal fossa concerning for DVT at this time.  Suspect this is likely due to arthritis given the crepitus here on exam.  I will give the patient Toradol and Percocet here for pain.  Will obtain x-ray and reevaluate.  If his x-ray is largely unremarkable will refer him to orthopedics for further evaluation.  The patient's x-ray does show significant arthritis with a small to moderate joint effusion.  Again with no fevers and no significant stiffness, warmth, or erythema suspicion for septic arthritis is low.  The patient be discharged with symptomatic treatment with orthopedic follow-up with return precautions.  Amount and/or Complexity of Data Reviewed Radiology: ordered.  Risk Prescription drug management.        Final diagnoses:  Osteoarthritis of left knee, unspecified osteoarthritis type    ED Discharge Orders          Ordered    naproxen (NAPROSYN) 500 MG tablet  2 times daily        05/13/24 0758    oxyCODONE-acetaminophen (PERCOCET/ROXICET) 5-325 MG tablet  Every 6 hours PRN        05/13/24 0758               Ula Prentice SAUNDERS, MD 05/13/24 [phone]

## 2024-05-13 NOTE — ED Triage Notes (Signed)
 Pt reports with sharp left knee pain and swelling since Monday.
